# Patient Record
Sex: Male | Born: 1980
Health system: Southern US, Community
[De-identification: ages and names within clinical notes are randomized; demographics above are authoritative.]

## PROBLEM LIST (undated history)

## (undated) DIAGNOSIS — E559 Vitamin D deficiency, unspecified: Secondary | ICD-10-CM

## (undated) DIAGNOSIS — I10 Essential (primary) hypertension: Secondary | ICD-10-CM

## (undated) DIAGNOSIS — E291 Testicular hypofunction: Secondary | ICD-10-CM

## (undated) DIAGNOSIS — E119 Type 2 diabetes mellitus without complications: Secondary | ICD-10-CM

## (undated) DIAGNOSIS — G4733 Obstructive sleep apnea (adult) (pediatric): Secondary | ICD-10-CM

## (undated) HISTORY — PX: SHOULDER ARTHROSCOPY W/ ROTATOR CUFF REPAIR: SHX2400

## (undated) HISTORY — DX: Type 2 diabetes mellitus without complications: E11.9

## (undated) HISTORY — PX: ACNE CYST REMOVAL: SUR1112

## (undated) HISTORY — DX: Essential (primary) hypertension: I10

## (undated) HISTORY — DX: Obstructive sleep apnea (adult) (pediatric): G47.33

## (undated) HISTORY — DX: Vitamin D deficiency, unspecified: E55.9

## (undated) HISTORY — DX: Testicular hypofunction: E29.1

---

## 2003-03-13 ENCOUNTER — Emergency Department (HOSPITAL_COMMUNITY): Admission: EM | Admit: 2003-03-13 | Discharge: 2003-03-13 | Payer: Self-pay | Admitting: Emergency Medicine

## 2013-09-03 ENCOUNTER — Encounter (HOSPITAL_COMMUNITY): Payer: Self-pay | Admitting: Emergency Medicine

## 2013-09-03 ENCOUNTER — Emergency Department (HOSPITAL_COMMUNITY)
Admission: EM | Admit: 2013-09-03 | Discharge: 2013-09-03 | Disposition: A | Discharge status: Home / Self Care | Payer: 59 | Attending: Emergency Medicine | Admitting: Emergency Medicine

## 2013-09-03 ENCOUNTER — Emergency Department (HOSPITAL_COMMUNITY): Payer: 59

## 2013-09-03 DIAGNOSIS — R0789 Other chest pain: Secondary | ICD-10-CM | POA: Insufficient documentation

## 2013-09-03 LAB — CBC
HCT: 48.6 % (ref 39.0–52.0)
Hemoglobin: 17.5 g/dL — ABNORMAL HIGH (ref 13.0–17.0)
MCH: 32.3 pg (ref 26.0–34.0)
MCHC: 36 g/dL (ref 30.0–36.0)
MCV: 89.7 fL (ref 78.0–100.0)
Platelets: 236 10*3/uL (ref 150–400)
RBC: 5.42 MIL/uL (ref 4.22–5.81)
RDW: 12.5 % (ref 11.5–15.5)
WBC: 13.1 10*3/uL — ABNORMAL HIGH (ref 4.0–10.5)

## 2013-09-03 LAB — BASIC METABOLIC PANEL
BUN: 16 mg/dL (ref 6–23)
CO2: 22 mEq/L (ref 19–32)
Calcium: 9.4 mg/dL (ref 8.4–10.5)
Chloride: 105 mEq/L (ref 96–112)
Creatinine, Ser: 1 mg/dL (ref 0.50–1.35)
GFR calc Af Amer: >90 mL/min (ref >90)
GFR calc non Af Amer: >90 mL/min (ref >90)
Glucose, Bld: 108 mg/dL — ABNORMAL HIGH (ref 70–99)
Potassium: 4 mEq/L (ref 3.7–5.3)
Sodium: 141 mEq/L (ref 137–147)

## 2013-09-03 LAB — I-STAT TROPONIN, ED: Troponin i, poc: 0 ng/mL (ref 0.00–0.08)

## 2013-09-03 LAB — D-DIMER, QUANTITATIVE: D-Dimer, Quant: <0.27 ug/mL-FEU (ref 0.00–0.48)

## 2013-09-03 MED ORDER — HYDROCODONE-ACETAMINOPHEN 5-325 MG PO TABS: 1.0000 | ORAL_TABLET | ORAL | Status: DC | PRN

## 2013-09-03 MED ORDER — IBUPROFEN 600 MG PO TABS: 600.0000 mg | ORAL_TABLET | Freq: Four times a day (QID) | ORAL | Status: DC | PRN

## 2013-09-03 MED ORDER — KETOROLAC TROMETHAMINE 30 MG/ML IJ SOLN
30.0000 mg | Freq: Once | INTRAMUSCULAR | Status: AC
  Administered 2013-09-03: 30 mg via INTRAVENOUS
  Filled 2013-09-03: qty 1

## 2013-09-03 NOTE — Discharge Planning (Signed)
W9UE Felicia E, Community Liaison  Spoke to patient about follow up care. Explained to patient how to find a provider who accepts his insurance. Resource guide and my contact information was provider.

## 2013-09-03 NOTE — Discharge Instructions (Signed)

## 2013-09-03 NOTE — ED Notes (Signed)
Started two days ago, patient was working no heavy lifting but constant movement. Nausea no vomiting, weakness bilateral lower legs.  No medications taken at this time.

## 2013-09-03 NOTE — ED Provider Notes (Signed)
TIME SEEN: 2:16 PM  CHIEF COMPLAINT: Chest pain  HPI: Patient is a 33 y.o. M with no significant past medical history who presents to the emergency department with 2 days of constant substernal chest pain that he describes as a sharp pain. He denies any associated shortness of breath, nausea, vomiting, diaphoresis or dizziness. No lower extremity swelling or pain. No prior history of PE or DVT, recent prolonged immobilization such as long flight or hospitalization, recent fracture, surgery, trauma. He has a family history of his grandparents with cardiac disease but no history of premature CAD. Denies any fevers or cough. Denies a history is exertional. He states it is slightly pleuritic and worse with movement.  ROS: See HPI Constitutional: no fever  Eyes: no drainage  ENT: no runny nose   Cardiovascular:   chest pain  Resp: no SOB  GI: no vomiting GU: no dysuria Integumentary: no rash  Allergy: no hives  Musculoskeletal: no leg swelling  Neurological: no slurred speech ROS otherwise negative  PAST MEDICAL HISTORY/PAST SURGICAL HISTORY:  History reviewed. No pertinent past medical history.  MEDICATIONS:  Prior to Admission medications   Not on File    ALLERGIES:  No Known Allergies  SOCIAL HISTORY:  History  Substance Use Topics  . Smoking status: Never Smoker   . Smokeless tobacco: Not on file  . Alcohol Use: No    FAMILY HISTORY: History reviewed. No pertinent family history.  EXAM: BP 142/98  Pulse 105  Temp(Src) 97.9 F (36.6 C) (Oral)  Ht 6' (1.829 m)  Wt 301 lb (136.533 kg)  BMI 40.81 kg/m2  SpO2 96% CONSTITUTIONAL: Alert and oriented and responds appropriately to questions. Well-appearing; well-nourished HEAD: Normocephalic EYES: Conjunctivae clear, PERRL ENT: normal nose; no rhinorrhea; moist mucous membranes; pharynx without lesions noted NECK: Supple, no meningismus, no LAD  CARD: Regular and tachycardic; S1 and S2 appreciated; no murmurs, no clicks,  no rubs, no gallops RESP: Normal chest excursion without splinting or tachypnea; breath sounds clear and equal bilaterally; no wheezes, no rhonchi, no rales, mild tenderness to palpation over the anterior chest wall without crepitus or ecchymosis or deformity ABD/GI: Normal bowel sounds; non-distended; soft, non-tender, no rebound, no guarding BACK:  The back appears normal and is non-tender to palpation, there is no CVA tenderness EXT: Normal ROM in all joints; non-tender to palpation; no edema; normal capillary refill; no cyanosis    SKIN: Normal color for age and race; warm NEURO: Moves all extremities equally PSYCH: The patient's mood and manner are appropriate. Grooming and personal hygiene are appropriate.  MEDICAL DECISION MAKING: Patient here with atypical chest pain. His only risk factor for ACS is his family history but there is no history of premature CAD. He is mildly tachycardic but otherwise hemodynamically stable. We'll obtain cardiac labs, troponin, chest x-ray. We'll also obtain a d-dimer given he is tachycardic and has some pleuritic chest pain. We'll give Toradol and reassess.  ED PROGRESS: Patient reports his pain is markedly improved with Toradol. His labs are unremarkable including a negative troponin and d-dimer. Chest x-ray clear. Given his pain is been constant for 2 days, I do not feel he needs serial set of enzymes. I discussed with patient this is likely chest wall pain I feel he is safe to go home. Have given strict return precautions. Patient verbalizes understanding and is comfortable with this plan. He is pleased with his care.     Date: 09/03/2013  Rate: 102  Rhythm: Sinus tachycardia  QRS Axis:  normal  Intervals: normal  ST/T Wave abnormalities: normal  Conduction Disutrbances: none  Narrative Interpretation: Sinus tachycardia, no ischemic changes, no arrhythmia      Delice Bison Ward, DO 09/03/13 1526

## 2015-11-30 DIAGNOSIS — R079 Chest pain, unspecified: Secondary | ICD-10-CM | POA: Insufficient documentation

## 2015-12-01 DIAGNOSIS — R030 Elevated blood-pressure reading, without diagnosis of hypertension: Secondary | ICD-10-CM | POA: Insufficient documentation

## 2015-12-01 DIAGNOSIS — I10 Essential (primary) hypertension: Secondary | ICD-10-CM | POA: Insufficient documentation

## 2016-04-29 ENCOUNTER — Encounter (HOSPITAL_BASED_OUTPATIENT_CLINIC_OR_DEPARTMENT_OTHER): Payer: Self-pay | Admitting: *Deleted

## 2016-04-29 ENCOUNTER — Emergency Department (HOSPITAL_BASED_OUTPATIENT_CLINIC_OR_DEPARTMENT_OTHER)
Admission: EM | Admit: 2016-04-29 | Discharge: 2016-04-29 | Disposition: A | Discharge status: Home / Self Care | Payer: Self-pay | Attending: Emergency Medicine | Admitting: Emergency Medicine

## 2016-04-29 DIAGNOSIS — K529 Noninfective gastroenteritis and colitis, unspecified: Secondary | ICD-10-CM

## 2016-04-29 LAB — URINALYSIS, MICROSCOPIC (REFLEX): RBC / HPF: NONE SEEN RBC/hpf (ref 0–5)

## 2016-04-29 LAB — BASIC METABOLIC PANEL
Anion gap: 11 (ref 5–15)
BUN: 15 mg/dL (ref 6–20)
CO2: 22 mmol/L (ref 22–32)
Calcium: 9 mg/dL (ref 8.9–10.3)
Chloride: 106 mmol/L (ref 101–111)
Creatinine, Ser: 1 mg/dL (ref 0.61–1.24)
GFR calc Af Amer: >60 mL/min (ref >60)
GFR calc non Af Amer: >60 mL/min (ref >60)
Glucose, Bld: 106 mg/dL — ABNORMAL HIGH (ref 65–99)
Potassium: 4 mmol/L (ref 3.5–5.1)
Sodium: 139 mmol/L (ref 135–145)

## 2016-04-29 LAB — CBC WITH DIFFERENTIAL/PLATELET
Basophils Absolute: 0 10*3/uL (ref 0.0–0.1)
Basophils Relative: 0 %
Eosinophils Absolute: 0.2 10*3/uL (ref 0.0–0.7)
Eosinophils Relative: 2 %
HCT: 49.2 % (ref 39.0–52.0)
Hemoglobin: 16.9 g/dL (ref 13.0–17.0)
Lymphocytes Relative: 10 %
Lymphs Abs: 1.1 10*3/uL (ref 0.7–4.0)
MCH: 31.2 pg (ref 26.0–34.0)
MCHC: 34.3 g/dL (ref 30.0–36.0)
MCV: 90.9 fL (ref 78.0–100.0)
Monocytes Absolute: 0.9 10*3/uL (ref 0.1–1.0)
Monocytes Relative: 8 %
Neutro Abs: 9.1 10*3/uL — ABNORMAL HIGH (ref 1.7–7.7)
Neutrophils Relative %: 80 %
Platelets: 215 10*3/uL (ref 150–400)
RBC: 5.41 MIL/uL (ref 4.22–5.81)
RDW: 12.7 % (ref 11.5–15.5)
WBC: 11.4 10*3/uL — ABNORMAL HIGH (ref 4.0–10.5)

## 2016-04-29 LAB — URINALYSIS, ROUTINE W REFLEX MICROSCOPIC
Glucose, UA: NEGATIVE mg/dL
Hgb urine dipstick: NEGATIVE
Ketones, ur: NEGATIVE mg/dL
Nitrite: NEGATIVE
Protein, ur: NEGATIVE mg/dL
Specific Gravity, Urine: 1.024 (ref 1.005–1.030)
pH: 6 (ref 5.0–8.0)

## 2016-04-29 MED ORDER — KETOROLAC TROMETHAMINE 15 MG/ML IJ SOLN
15.0000 mg | Freq: Once | INTRAMUSCULAR | Status: AC
  Administered 2016-04-29: 15 mg via INTRAVENOUS
  Filled 2016-04-29: qty 1

## 2016-04-29 MED ORDER — SODIUM CHLORIDE 0.9 % IV BOLUS (SEPSIS)
1000.0000 mL | Freq: Once | INTRAVENOUS | Status: AC
  Administered 2016-04-29: 1000 mL via INTRAVENOUS

## 2016-04-29 MED ORDER — ONDANSETRON HCL 4 MG/2ML IJ SOLN
4.0000 mg | Freq: Once | INTRAMUSCULAR | Status: AC
  Administered 2016-04-29: 4 mg via INTRAVENOUS
  Filled 2016-04-29: qty 2

## 2016-04-29 NOTE — ED Notes (Signed)
Given saltines and sprite, states "feels much better" .

## 2016-04-29 NOTE — ED Provider Notes (Signed)
[place] [facility]PT [facility] Provider Note   CSN: [id number] Arrival date & time: 04/29/16  1749  By signing my name below, I, Hansel Feinstein, attest that this documentation has been prepared under the direction and in the presence of Virgel Manifold, MD. Electronically Signed: Hansel Feinstein, ED Scribe. 04/29/16. 6:14 PM.     History   Chief Complaint Chief Complaint  Patient presents with  . Emesis  . Back Pain  . Diarrhea    HPI Terrence Hoover is a 35 y.o. male who presents to the Emergency Department complaining of moderate, persistent nausea that began at 4 am this morning with associated emesis, diarrhea, generalized myalgias, chills, lower back pain. He also reports occasional epigastric cramping. Pt notes he ate chicken at a function last night, but has no other suspicious food intake. No known sick contact with similar symptoms, but pt works with EMS. Pt denies taking OTC medications at home to improve symptoms. He denies melena, hematochezia, hemoptysis.   The history is provided by the patient. No language interpreter was used.    History reviewed. No pertinent past medical history.  There are no active problems to display for this patient.   History reviewed. No pertinent surgical history.     Home Medications    Prior to Admission medications   Medication Sig Start Date End Date Taking? Authorizing Provider  HYDROcodone-acetaminophen (NORCO/VICODIN) 5-325 MG per tablet Take 1 tablet by mouth every 4 (four) hours as needed. 09/03/13   Kristen N Ward, DO  ibuprofen (ADVIL,MOTRIN) 600 MG tablet Take 1 tablet (600 mg total) by mouth every 6 (six) hours as needed. 09/03/13   [place], DO    Family History No family history on file.  Social History Social History  Substance Use Topics  . Smoking status: Never Smoker  . Smokeless tobacco: Never Used  . Alcohol use No     Allergies   Morphine and related   Review of Systems Review of Systems    Constitutional: Positive for chills.  Gastrointestinal: Positive for abdominal pain, diarrhea, nausea and vomiting. Negative for blood in stool.  Musculoskeletal: Positive for back pain and myalgias.  All other systems reviewed and are negative.   Physical Exam Updated Vital Signs BP 157/99   Pulse 120   Temp 99.3 F (37.4 C) (Oral)   Resp 20   Ht 6' (1.829 m)   Wt (!) 302 lb (137 kg)   SpO2 98%   BMI 40.96 kg/m   Physical Exam  Constitutional: He is oriented to person, place, and time. He appears well-developed and well-nourished.  HENT:  Head: Normocephalic and atraumatic.  Eyes: EOM are normal.  Neck: Normal range of motion.  Cardiovascular: Normal rate, regular rhythm, normal heart sounds and intact distal pulses.   Pulmonary/Chest: Effort normal and breath sounds normal. No respiratory distress.  Abdominal: Soft. He exhibits no distension. There is tenderness.  Mild epigastric tenderness.   Musculoskeletal: Normal range of motion.  Neurological: He is alert and oriented to person, place, and time.  Skin: Skin is warm and dry.  Psychiatric: He has a normal mood and affect. Judgment normal.  Nursing note and vitals reviewed.   ED Treatments / Results   DIAGNOSTIC STUDIES: Oxygen Saturation is 98% on RA, normal by my interpretation.    COORDINATION OF CARE: 6:09 PM Discussed treatment plan with pt at bedside which includes lab work and pt agreed to plan.    Labs (all labs ordered are listed, but only abnormal  results are displayed) Labs Reviewed  URINALYSIS, ROUTINE W REFLEX MICROSCOPIC    EKG  EKG Interpretation None       Radiology No results found.  Procedures Procedures (including critical care time)  Medications Ordered in ED Medications - No data to display   Initial Impression / Assessment and Plan / ED Course  I have reviewed the triage vital signs and the nursing notes.  Pertinent labs & imaging results that were available during my  care of the patient were reviewed by me and considered in my medical decision making (see chart for details).  Clinical Course     39 six-year-old male with what I suspect is viral GI illness. His normal exam is fairly benign. Minimal epigastric to periumbilical tenderness. Treated symptomatically significant improvement of his symptoms. His tachycardia has improved. He is normotensive. I doubt acute emergent surgical process or other emergent condition.  Final Clinical Impressions(s) / ED Diagnoses   Final diagnoses:  Gastroenteritis    New Prescriptions New Prescriptions   No medications on file    I personally preformed the services scribed in my presence. The recorded information has been reviewed is accurate. Virgel Manifold, MD.    Virgel Manifold, MD 05/04/16 [id number]

## 2016-04-29 NOTE — ED Triage Notes (Signed)
Nausea, vomiting and diarrhea. Back pain.

## 2016-04-29 NOTE — ED Notes (Signed)
States at 0430 this am started having diarrhea and cramping and lower back pain. Tender to epigastric area with palpation.

## 2017-01-21 ENCOUNTER — Emergency Department (HOSPITAL_BASED_OUTPATIENT_CLINIC_OR_DEPARTMENT_OTHER)
Admission: EM | Admit: 2017-01-21 | Discharge: 2017-01-22 | Disposition: A | Discharge status: Home / Self Care | Payer: 59 | Attending: Emergency Medicine | Admitting: Emergency Medicine

## 2017-01-21 ENCOUNTER — Encounter (HOSPITAL_BASED_OUTPATIENT_CLINIC_OR_DEPARTMENT_OTHER): Payer: Self-pay | Admitting: Emergency Medicine

## 2017-01-21 DIAGNOSIS — Y929 Unspecified place or not applicable: Secondary | ICD-10-CM | POA: Diagnosis not present

## 2017-01-21 DIAGNOSIS — Y939 Activity, unspecified: Secondary | ICD-10-CM | POA: Insufficient documentation

## 2017-01-21 DIAGNOSIS — S46011A Strain of muscle(s) and tendon(s) of the rotator cuff of right shoulder, initial encounter: Secondary | ICD-10-CM

## 2017-01-21 DIAGNOSIS — X58XXXA Exposure to other specified factors, initial encounter: Secondary | ICD-10-CM | POA: Insufficient documentation

## 2017-01-21 DIAGNOSIS — Y990 Civilian activity done for income or pay: Secondary | ICD-10-CM | POA: Diagnosis not present

## 2017-01-21 DIAGNOSIS — M25511 Pain in right shoulder: Secondary | ICD-10-CM | POA: Diagnosis present

## 2017-01-21 DIAGNOSIS — S43421A Sprain of right rotator cuff capsule, initial encounter: Secondary | ICD-10-CM | POA: Insufficient documentation

## 2017-01-21 DIAGNOSIS — Y999 Unspecified external cause status: Secondary | ICD-10-CM | POA: Insufficient documentation

## 2017-01-21 MED ORDER — KETOROLAC TROMETHAMINE 30 MG/ML IJ SOLN
30.0000 mg | Freq: Once | INTRAMUSCULAR | Status: AC
  Administered 2017-01-21: 30 mg via INTRAMUSCULAR
  Filled 2017-01-21: qty 1

## 2017-01-21 MED ORDER — IBUPROFEN 600 MG PO TABS: 600.0000 mg | ORAL_TABLET | Freq: Four times a day (QID) | ORAL | 0 refills | Status: DC | PRN

## 2017-01-21 NOTE — ED Triage Notes (Signed)
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 He then lifted her up and hurt his right shoulder. He now has pain to his right shoulder, elbow and hand - feels like it is swollen as well

## 2017-01-21 NOTE — ED Notes (Signed)
EDP into room, prior to RN assessment, see MD notes, orders received to medicate and d/c. Care assumed at time of d/c.

## 2017-01-21 NOTE — ED Provider Notes (Signed)
[place] [facility] Provider Note   CSN: [id number] Arrival date & time: 01/21/17  2141     History   Chief Complaint Chief Complaint  Patient presents with  . Shoulder Pain    HPI Erland Vivas is a 36 y.o. male.  HPI  36 y.o. male, presents to the Emergency Department today due to right shoulder pain. 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 Pt proceeded to lift up patient and hurt his right shoulder. Notes pain in shoulder with radiation of pain into elbow and hand. Rates pain 4/10. Worse with ROM. Minimal at rest. No direct trauma was noted to area. Pt states shoulder feels a little swollen. Has not tired any OTC remedies. No other symptoms noted.    History reviewed. No pertinent past medical history.  There are no active problems to display for this patient.   History reviewed. No pertinent surgical history.     Home Medications    Prior to Admission medications   Medication Sig Start Date End Date Taking? Authorizing Provider  HYDROcodone-acetaminophen (NORCO/VICODIN) 5-325 MG per tablet Take 1 tablet by mouth every 4 (four) hours as needed. 09/03/13   Ward, Delice Bison, DO  ibuprofen (ADVIL,MOTRIN) 600 MG tablet Take 1 tablet (600 mg total) by mouth every 6 (six) hours as needed. 09/03/13   Ward, Delice Bison, DO    Family History History reviewed. No pertinent family history.  Social History Social History  Substance Use Topics  . Smoking status: Never Smoker  . Smokeless tobacco: Never Used  . Alcohol use No     Allergies   Morphine and related   Review of Systems Review of Systems ROS reviewed and all are negative for acute change except as noted in the HPI.  Physical Exam Updated Vital Signs BP (!) 138/93 (BP Location: Left Arm)   Pulse 97   Temp 98.4 F (36.9 C) (Oral)   Resp 18   Ht 6' (1.829 m)   Wt 131.5 kg (290 lb)   SpO2 100%   BMI 39.33 kg/m   Physical Exam  Constitutional: He is oriented to  person, place, and time. Vital signs are normal. He appears well-developed and well-nourished.  HENT:  Head: Normocephalic and atraumatic.  Right Ear: Hearing normal.  Left Ear: Hearing normal.  Eyes: Pupils are equal, round, and reactive to light. Conjunctivae and EOM are normal.  Neck: Normal range of motion. Neck supple.  Cardiovascular: Normal rate and regular rhythm.   Pulmonary/Chest: Effort normal.  Musculoskeletal: Normal range of motion.  Right Shoulder Pain with active/passive ROM. Noted mild pain with inversion/eversion as well as abduction. Minimal pain with passive ROM. Notable pain with active. TTP along AC joint. NVI. Distal pulses appreciated.   Neurological: He is alert and oriented to person, place, and time.  Skin: Skin is warm and dry.  Psychiatric: He has a normal mood and affect. His speech is normal and behavior is normal. Thought content normal.  Nursing note and vitals reviewed.    ED Treatments / Results  Labs (all labs ordered are listed, but only abnormal results are displayed) Labs Reviewed - No data to display  EKG  EKG Interpretation None       Radiology No results found.  Procedures Procedures (including critical care time)  Medications Ordered in ED Medications  ketorolac (TORADOL) 30 MG/ML injection 30 mg (not administered)     Initial Impression / Assessment and Plan / ED Course  I have reviewed the triage vital signs and the nursing notes.  Pertinent labs & imaging results that were available during my care of the patient were reviewed by me and considered in my medical decision making (see chart for details).  Final Clinical Impressions(s) / ED Diagnoses     {I have reviewed the relevant previous healthcare records.  {I obtained HPI from historian.   ED Course:  Assessment: Pt is a 36 y.o. male presents to the Emergency Department today due to right shoulder pain. 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 Pt proceeded to lift up patient and hurt his right shoulder. Notes pain in shoulder with radiation of pain into elbow and hand. Rates pain 4/10. Worse with ROM. Minimal at rest. No direct trauma was noted to area. Pt states shoulder feels a little swollen. Has not tired any OTC remedies. On exam, pt in NAD. Nontoxic/nonseptic appearing. VSS. Afebrile. Suspect rotator cuff injury. Doubt tear given adequate active ROM. Doubt fracture as no direct trauma. Given Toradol in ED. Plan is to DC home with follow up to Orthopedics. At time of discharge, Patient is in no acute distress. Vital Signs are stable. Patient is able to ambulate. Patient able to tolerate PO.    Disposition/Plan:  DC Home Additional Verbal discharge instructions given and discussed with patient.  Pt Instructed to f/u with Ortho in the next week for evaluation and treatment of symptoms. Return precautions given Pt acknowledges and agrees with plan  Supervising Physician Orpah Greek, *  Final diagnoses:  Acute pain of right shoulder  Strain of right rotator cuff capsule, initial encounter    New Prescriptions New Prescriptions   No medications on file     Shary Decamp, Hershal Coria 01/21/17 2347    Orpah Greek, MD 01/22/17 [phone]

## 2017-01-21 NOTE — Discharge Instructions (Signed)
Please read and follow all provided instructions.  Your diagnoses today include:  1. Acute pain of right shoulder   2. Strain of right rotator cuff capsule, initial encounter     Tests performed today include: Vital signs. See below for your results today.   Medications prescribed:  Take as prescribed   Home care instructions:  Follow any educational materials contained in this packet.  Follow-up instructions: Please follow-up with your orthopedics for further evaluation of symptoms and treatment   Return instructions:  Please return to the Emergency Department if you do not get better, if you get worse, or new symptoms OR  - Fever (temperature greater than 101.58F)  - Bleeding that does not stop with holding pressure to the area    -Severe pain (please note that you may be more sore the day after your accident)  - Chest Pain  - Difficulty breathing  - Severe nausea or vomiting  - Inability to tolerate food and liquids  - Passing out  - Skin becoming red around your wounds  - Change in mental status (confusion or lethargy)  - New numbness or weakness    Please return if you have any other emergent concerns.  Additional Information:  Your vital signs today were: BP (!) 138/93 (BP Location: Left Arm)    Pulse 97    Temp 98.4 F (36.9 C) (Oral)    Resp 18    Ht 6' (1.829 m)    Wt 131.5 kg (290 lb)    SpO2 100%    BMI 39.33 kg/m  If your blood pressure (BP) was elevated above 135/85 this visit, please have this repeated by your doctor within one month. ---------------

## 2017-04-17 ENCOUNTER — Ambulatory Visit (INDEPENDENT_AMBULATORY_CARE_PROVIDER_SITE_OTHER): Payer: 59

## 2017-04-17 ENCOUNTER — Encounter (INDEPENDENT_AMBULATORY_CARE_PROVIDER_SITE_OTHER): Payer: Self-pay | Admitting: Orthopedic Surgery

## 2017-04-17 ENCOUNTER — Ambulatory Visit (INDEPENDENT_AMBULATORY_CARE_PROVIDER_SITE_OTHER): Payer: 59 | Admitting: Family

## 2017-04-17 DIAGNOSIS — M25511 Pain in right shoulder: Secondary | ICD-10-CM | POA: Diagnosis not present

## 2017-04-17 DIAGNOSIS — M7541 Impingement syndrome of right shoulder: Secondary | ICD-10-CM

## 2017-04-17 MED ORDER — LIDOCAINE HCL 1 % IJ SOLN
5.0000 mL | INTRAMUSCULAR | Status: AC | PRN
  Administered 2017-04-17: 5 mL

## 2017-04-17 MED ORDER — METHYLPREDNISOLONE ACETATE 40 MG/ML IJ SUSP
40.0000 mg | INTRAMUSCULAR | Status: AC | PRN
  Administered 2017-04-17: 40 mg via INTRA_ARTICULAR

## 2017-04-17 NOTE — Progress Notes (Signed)
   Office Visit Note   Patient: Terrence Hoover           Date of Birth: 02-12-1981           MRN: [id number] Visit Date: 04/17/2017              Requested by: No referring provider defined for this encounter. PCP: Patient, No Pcp Per  Chief Complaint  Patient presents with  . Right Shoulder - Pain      HPI: The patient is a 36 year old gentleman who presents today complaining of a 2 month history of right shoulder pain, primarily anterior that radiates down bicep. 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 No immediate sensation of popping or tearing. Has gradually worsened. Did have some relief a few weeks ago with Ibu. Pain has returned. Pain with above head, behind back reaching and lifting.   Assessment & Plan: Visit Diagnoses:  1. Right shoulder pain, unspecified chronicity   2. Impingement syndrome of right shoulder     Plan: depomedrol injection today. May consider MRI if no relief with injection. Continue icing. May use NSAIDs.  Follow-Up Instructions: Return in about 4 weeks (around 05/15/2017).   Right Shoulder Exam   Tenderness  The patient is experiencing tenderness in the biceps tendon.  Range of Motion  The patient has normal right shoulder ROM.  Tests  Impingement: positive Drop arm: negative  Other  Erythema: absent Sensation: normal Pulse: present      Patient is alert, oriented, no adenopathy, well-dressed, normal affect, normal respiratory effort.   Imaging: Xr Shoulder Right  Result Date: 04/17/2017 Radiographs of right shoulder are negative for acute finding.  No images are attached to the encounter.  Labs: No results found for: HGBA1C, ESRSEDRATE, CRP, LABURIC, REPTSTATUS, GRAMSTAIN, CULT, LABORGA  @LABSALLVALUES (HGBA1)@  There is no height or weight on file to calculate BMI.  Orders:  Orders Placed This Encounter  Procedures  . XR Shoulder Right   No orders of the defined types were placed in this  encounter.    Procedures: Large Joint Inj: R subacromial bursa on 04/17/2017 4:54 PM Indications: pain Details: 22 G 1.5 in needle Medications: 5 mL lidocaine 1 %; 40 mg methylPREDNISolone acetate 40 MG/ML Consent was given by the patient.      Clinical Data: No additional findings.  ROS:  All other systems negative, except as noted in the HPI. Review of Systems  Constitutional: Negative for chills and fever.  Musculoskeletal: Positive for arthralgias and myalgias.  Neurological: Negative for weakness and numbness.    Objective: Vital Signs: There were no vitals taken for this visit.  Specialty Comments:  No specialty comments available.  PMFS History: There are no active problems to display for this patient.  History reviewed. No pertinent past medical history.  History reviewed. No pertinent family history.  History reviewed. No pertinent surgical history. Social History   Occupational History  . Not on file  Tobacco Use  . Smoking status: Never Smoker  . Smokeless tobacco: Never Used  Substance and Sexual Activity  . Alcohol use: No  . Drug use: Not on file  . Sexual activity: Not on file

## 2017-05-22 ENCOUNTER — Ambulatory Visit (INDEPENDENT_AMBULATORY_CARE_PROVIDER_SITE_OTHER): Payer: 59 | Admitting: Family

## 2017-05-29 ENCOUNTER — Encounter (INDEPENDENT_AMBULATORY_CARE_PROVIDER_SITE_OTHER): Payer: Self-pay | Admitting: Orthopedic Surgery

## 2017-05-29 ENCOUNTER — Ambulatory Visit (INDEPENDENT_AMBULATORY_CARE_PROVIDER_SITE_OTHER): Payer: 59 | Admitting: Orthopedic Surgery

## 2017-05-29 DIAGNOSIS — M7541 Impingement syndrome of right shoulder: Secondary | ICD-10-CM

## 2017-05-29 DIAGNOSIS — M25511 Pain in right shoulder: Secondary | ICD-10-CM

## 2017-05-29 DIAGNOSIS — G8929 Other chronic pain: Secondary | ICD-10-CM | POA: Diagnosis not present

## 2017-05-29 NOTE — Progress Notes (Signed)
   Office Visit Note   Patient: Terrence Hoover           Date of Birth: 1981-01-08           MRN: [id number] Visit Date: 05/29/2017              Requested by: No referring provider defined for this encounter. PCP: Patient, No Pcp Per  Chief Complaint  Patient presents with  . Right Shoulder - Pain      HPI: Patient is a 37 year old gentleman who presents in follow-up for impingement symptoms of the right shoulder.  Patient has pain that radiates down into his biceps.  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  He has been to the emergency department at [facility] on his last examination here he did undergo a subacromial injection which she states provided him about 1 weeks pain relief.  Patient complains of pain with activities of daily living pain with elevation difficulty getting a shirt on and off.  Assessment & Plan: Visit Diagnoses:  1. Impingement syndrome of right shoulder     Plan: We will obtain an MRI scan of the right shoulder follow-up after this is obtained.  Discussed that he may benefit from arthroscopic intervention of the right shoulder.  Follow-Up Instructions: Return in about 2 weeks (around 06/12/2017).   Ortho Exam  Patient is alert, oriented, no adenopathy, well-dressed, normal affect, normal respiratory effort. Examination patient has full range of motion of the left shoulder right shoulder has active abduction flexion to 90 degrees.  He has good passive range of motion with no adhesive capsulitis.  He has pain with Neer and Hawkins impingement test pain with a drop arm test the biceps tendon is nontender to palpation the [facility] joint is nontender to palpation.  Imaging: No results found. No images are attached to the encounter.  Labs: No results found for: HGBA1C, ESRSEDRATE, CRP, LABURIC, REPTSTATUS, GRAMSTAIN, CULT, LABORGA  @LABSALLVALUES (HGBA1)@  There is no height or weight on file to calculate  BMI.  Orders:  No orders of the defined types were placed in this encounter.  No orders of the defined types were placed in this encounter.    Procedures: No procedures performed  Clinical Data: No additional findings.  ROS:  All other systems negative, except as noted in the HPI. Review of Systems  Objective: Vital Signs: There were no vitals taken for this visit.  Specialty Comments:  No specialty comments available.  PMFS History: There are no active problems to display for this patient.  History reviewed. No pertinent past medical history.  History reviewed. No pertinent family history.  History reviewed. No pertinent surgical history. Social History   Occupational History  . Not on file  Tobacco Use  . Smoking status: Never Smoker  . Smokeless tobacco: Never Used  Substance and Sexual Activity  . Alcohol use: No  . Drug use: Not on file  . Sexual activity: Not on file

## 2017-06-07 ENCOUNTER — Ambulatory Visit
Admission: RE | Admit: 2017-06-07 | Discharge: 2017-06-07 | Disposition: A | Discharge status: Home / Self Care | Payer: 59 | Source: Ambulatory Visit | Attending: Orthopedic Surgery | Admitting: Orthopedic Surgery

## 2017-06-07 DIAGNOSIS — M25511 Pain in right shoulder: Principal | ICD-10-CM

## 2017-06-07 DIAGNOSIS — G8929 Other chronic pain: Secondary | ICD-10-CM

## 2017-06-14 ENCOUNTER — Ambulatory Visit (INDEPENDENT_AMBULATORY_CARE_PROVIDER_SITE_OTHER): Payer: 59 | Admitting: Orthopedic Surgery

## 2017-06-14 ENCOUNTER — Encounter (INDEPENDENT_AMBULATORY_CARE_PROVIDER_SITE_OTHER): Payer: Self-pay | Admitting: Orthopedic Surgery

## 2017-06-14 DIAGNOSIS — S43431D Superior glenoid labrum lesion of right shoulder, subsequent encounter: Secondary | ICD-10-CM

## 2017-06-14 NOTE — Progress Notes (Addendum)
   Office Visit Note   Patient: Terrence Hoover           Date of Birth: 01-30-1981           MRN: [id number] Visit Date: 06/14/2017              Requested by: No referring provider defined for this encounter. PCP: Patient, No Pcp Per  Chief Complaint  Patient presents with  . Right Shoulder - Pain      HPI: Patient is a 37 year old gentleman with persistent rotator cuff pathology of his right shoulder he has failed conservative therapy and is status post MRI scan.  Patient states he still symptomatic anteriorly over the biceps.  Assessment & Plan: Visit Diagnoses:  1. Superior glenoid labrum lesion of right shoulder, subsequent encounter     Plan: Discussed continued conservative therapy versus arthroscopic intervention for debridement and decompression.  Patient states he would like to proceed with arthroscopic intervention.  Risk and benefits were discussed including infection persistent pain need for additional surgery.  Stated the patient should be approximately 75% better.  Patient states he understands wished to proceed at this time he would be out of work for at least 2 weeks possibly 4 weeks postoperatively.  Follow-Up Instructions: Return in about 2 weeks (around 06/28/2017).   Ortho Exam  Patient is alert, oriented, no adenopathy, well-dressed, normal affect, normal respiratory effort. Examination patient has full range of motion of both shoulders.  He has no tenderness to palpation over the [facility] joint bilaterally.  He is point tender to palpation of the biceps a empty can test reproduces labral pathology pain Neer and Hawkins impingement tests are painful.  Patient has good strength in his upper extremity.  Review of the MRI scan shows increased signal at the superior labrum consistent with a SLAP tear.  Patient does have some inflammation of the rotator cuff and some mild degenerative changes of the [facility] joint.  Imaging: No results found. No images are attached to the  encounter.  Labs: No results found for: HGBA1C, ESRSEDRATE, CRP, LABURIC, REPTSTATUS, GRAMSTAIN, CULT, LABORGA  @LABSALLVALUES (HGBA1)@  There is no height or weight on file to calculate BMI.  Orders:  No orders of the defined types were placed in this encounter.  No orders of the defined types were placed in this encounter.    Procedures: No procedures performed  Clinical Data: No additional findings.  ROS:  All other systems negative, except as noted in the HPI. Review of Systems  Objective: Vital Signs: There were no vitals taken for this visit.  Specialty Comments:  No specialty comments available.  PMFS History: There are no active problems to display for this patient.  History reviewed. No pertinent past medical history.  History reviewed. No pertinent family history.  History reviewed. No pertinent surgical history. Social History   Occupational History  . Not on file  Tobacco Use  . Smoking status: Never Smoker  . Smokeless tobacco: Never Used  Substance and Sexual Activity  . Alcohol use: No  . Drug use: Not on file  . Sexual activity: Not on file

## 2017-08-07 ENCOUNTER — Telehealth (INDEPENDENT_AMBULATORY_CARE_PROVIDER_SITE_OTHER): Payer: Self-pay | Admitting: Orthopedic Surgery

## 2017-08-07 NOTE — Telephone Encounter (Signed)
Patients wife called asking if she could speak with you about scheduling her husbands surgery. [phone]

## 2017-08-08 NOTE — Telephone Encounter (Signed)
Terrence Hoover spoke with [name] in the office today regarding surgery.

## 2017-08-15 DIAGNOSIS — M75121 Complete rotator cuff tear or rupture of right shoulder, not specified as traumatic: Secondary | ICD-10-CM | POA: Diagnosis not present

## 2017-08-15 DIAGNOSIS — M7541 Impingement syndrome of right shoulder: Secondary | ICD-10-CM | POA: Diagnosis not present

## 2017-08-22 ENCOUNTER — Encounter (INDEPENDENT_AMBULATORY_CARE_PROVIDER_SITE_OTHER): Payer: Self-pay | Admitting: Orthopedic Surgery

## 2017-08-22 ENCOUNTER — Ambulatory Visit (INDEPENDENT_AMBULATORY_CARE_PROVIDER_SITE_OTHER): Payer: 59 | Admitting: Orthopedic Surgery

## 2017-08-22 DIAGNOSIS — S43431D Superior glenoid labrum lesion of right shoulder, subsequent encounter: Secondary | ICD-10-CM

## 2017-08-22 DIAGNOSIS — M7541 Impingement syndrome of right shoulder: Secondary | ICD-10-CM

## 2017-08-22 NOTE — Progress Notes (Signed)
   Office Visit Note   Patient: Terrence Hoover           Date of Birth: 04/01/1981           MRN: [id number] Visit Date: 08/22/2017              Requested by: No referring provider defined for this encounter. PCP: Patient, No Pcp Per  Chief Complaint  Patient presents with  . Right Shoulder - Routine Post Op      HPI: Patient is a 37 year old gentleman status post right shoulder arthroscopy for debridement of labral tear debridement of rotator cuff tear as well as subacromial decompression.  Patient states he is improved range of motion he is not using the narcotic pain medicine.  Assessment & Plan: Visit Diagnoses:  1. Superior glenoid labrum lesion of right shoulder, subsequent encounter   2. Impingement syndrome of right shoulder     Plan: We will set him up for physical therapy at [facility] for range of motion strengthening and scapular stabilization.  Follow-up in 3 weeks with anticipated return to work at that time.  Follow-Up Instructions: Return in about 3 weeks (around 09/12/2017).   Ortho Exam  Patient is alert, oriented, no adenopathy, well-dressed, normal affect, normal respiratory effort. Examination the portals are clean and dry patient has good passive range of motion of the shoulder he is improving his active range of motion.  Imaging: No results found. No images are attached to the encounter.  Labs: No results found for: HGBA1C, ESRSEDRATE, CRP, LABURIC, REPTSTATUS, GRAMSTAIN, CULT, LABORGA  @LABSALLVALUES (HGBA1)@  There is no height or weight on file to calculate BMI.  Orders:  No orders of the defined types were placed in this encounter.  No orders of the defined types were placed in this encounter.    Procedures: No procedures performed  Clinical Data: No additional findings.  ROS:  All other systems negative, except as noted in the HPI. Review of Systems  Objective: Vital Signs: There were no vitals taken for this visit.  Specialty  Comments:  No specialty comments available.  PMFS History: There are no active problems to display for this patient.  History reviewed. No pertinent past medical history.  History reviewed. No pertinent family history.  History reviewed. No pertinent surgical history. Social History   Occupational History  . Not on file  Tobacco Use  . Smoking status: Never Smoker  . Smokeless tobacco: Never Used  Substance and Sexual Activity  . Alcohol use: No  . Drug use: Not on file  . Sexual activity: Not on file

## 2017-09-12 ENCOUNTER — Ambulatory Visit (INDEPENDENT_AMBULATORY_CARE_PROVIDER_SITE_OTHER): Payer: 59 | Admitting: Orthopedic Surgery

## 2017-09-12 ENCOUNTER — Encounter (INDEPENDENT_AMBULATORY_CARE_PROVIDER_SITE_OTHER): Payer: Self-pay | Admitting: Orthopedic Surgery

## 2017-09-12 DIAGNOSIS — S43431D Superior glenoid labrum lesion of right shoulder, subsequent encounter: Secondary | ICD-10-CM

## 2017-09-12 DIAGNOSIS — M7541 Impingement syndrome of right shoulder: Secondary | ICD-10-CM

## 2017-09-12 NOTE — Progress Notes (Signed)
   Office Visit Note   Patient: Terrence Hoover           Date of Birth: Oct 10, 1980           MRN: [id number] Visit Date: 09/12/2017              Requested by: No referring provider defined for this encounter. PCP: Patient, No Pcp Per  Chief Complaint  Patient presents with  . Right Shoulder - Pain      HPI: Patient is a 37 year old gentleman status post right shoulder arthroscopy debridement patient states he still has some soreness.  Denies any restrictions with range of motion.  States he cannot afford time physical therapy.  Assessment & Plan: Visit Diagnoses:  1. Superior glenoid labrum lesion of right shoulder, subsequent encounter   2. Impingement syndrome of right shoulder     Plan: Patient was given instructions for internal and external rotation strengthening scapular stabilization that he can do on his own.  Patient is given a note that he may return to work on May 4 without restrictions.  Follow-Up Instructions: Return if symptoms worsen or fail to improve.   Ortho Exam  Patient is alert, oriented, no adenopathy, well-dressed, normal affect, normal respiratory effort. Examination patient has full range of motion of the right shoulder there is no crepitation with range of motion the portals are clean and dry without signs of infection.  Imaging: No results found. No images are attached to the encounter.  Labs: No results found for: HGBA1C, ESRSEDRATE, CRP, LABURIC, REPTSTATUS, GRAMSTAIN, CULT, LABORGA  @LABALLVALUES (HGBA1C:3,ALB:3,PREALB:3)@  There is no height or weight on file to calculate BMI.  Orders:  No orders of the defined types were placed in this encounter.  No orders of the defined types were placed in this encounter.    Procedures: No procedures performed  Clinical Data: No additional findings.  ROS:  All other systems negative, except as noted in the HPI. Review of Systems  Objective: Vital Signs: There were no vitals taken for  this visit.  Specialty Comments:  No specialty comments available.  PMFS History: There are no active problems to display for this patient.  History reviewed. No pertinent past medical history.  History reviewed. No pertinent family history.  History reviewed. No pertinent surgical history. Social History   Occupational History  . Not on file  Tobacco Use  . Smoking status: Never Smoker  . Smokeless tobacco: Never Used  Substance and Sexual Activity  . Alcohol use: No  . Drug use: Not on file  . Sexual activity: Not on file

## 2018-07-31 ENCOUNTER — Ambulatory Visit: Payer: 59 | Admitting: Family Medicine

## 2018-08-08 ENCOUNTER — Ambulatory Visit: Payer: Self-pay | Admitting: Family Medicine

## 2018-08-31 ENCOUNTER — Ambulatory Visit: Payer: Self-pay

## 2018-08-31 NOTE — Telephone Encounter (Signed)
Call transferred to office per protocol  Answer Assessment - Initial Assessment Questions 1. REASON FOR CALL or QUESTION: "What is your reason for calling today?" or "How can I best help you?" or "What question do you have that I can help answer?"     I need to change my husbands appointment.  Protocols used: INFORMATION ONLY CALL-A-AH

## 2018-11-26 ENCOUNTER — Telehealth: Payer: Self-pay

## 2018-11-26 NOTE — Telephone Encounter (Signed)
Questions for Screening COVID-19  Symptom onset: none  Travel or Contacts: No travel, Yes contacts , however was in Full PPE on shift of [facility]. 3 positive cases while on shift.  During this illness, did/does the patient experience any of the following symptoms? Fever >100.55F []   Yes [x]   No []   Unknown Subjective fever (felt feverish) []   Yes [x]   No []   Unknown Chills []   Yes [x]   No []   Unknown Muscle aches (myalgia) []   Yes [x]   No []   Unknown Runny nose (rhinorrhea) []   Yes [x]   No []   Unknown Sore throat []   Yes [x]   No []   Unknown Cough (new onset or worsening of chronic cough) []   Yes [x]   No []   Unknown Shortness of breath (dyspnea) []   Yes [x]   No []   Unknown Nausea or vomiting []   Yes []   No []   Unknown Headache []   Yes [x]   No []   Unknown Abdominal pain  []   Yes [x]   No []   Unknown Diarrhea (?3 loose/looser than normal stools/24hr period) []   Yes [x]   No []   Unknown Other, specify:  Patient risk factors: Smoker? []   Current []   Former [x]   Never If male, currently pregnant? []   Yes [x]   No  There are no active problems to display for this patient.   Plan:  []   High risk for COVID-19 with red flags go to ED (with CP, SOB, weak/lightheaded, or fever > 101.5). Call ahead.  []   High risk for COVID-19 but stable. Inform provider and coordinate time for [facility] visit.   [x]   No red flags but URI signs or symptoms okay for [facility] visit.

## 2018-11-27 ENCOUNTER — Ambulatory Visit (INDEPENDENT_AMBULATORY_CARE_PROVIDER_SITE_OTHER): Payer: Managed Care, Other (non HMO) | Admitting: Family Medicine

## 2018-11-27 ENCOUNTER — Other Ambulatory Visit: Payer: Self-pay

## 2018-11-27 ENCOUNTER — Encounter: Payer: Self-pay | Admitting: Family Medicine

## 2018-11-27 VITALS — BP 138/98 | HR 90 | Temp 98.2°F | Resp 18 | Ht 70.25 in | Wt 325.0 lb

## 2018-11-27 DIAGNOSIS — Z13220 Encounter for screening for lipoid disorders: Secondary | ICD-10-CM | POA: Diagnosis not present

## 2018-11-27 DIAGNOSIS — G4700 Insomnia, unspecified: Secondary | ICD-10-CM | POA: Insufficient documentation

## 2018-11-27 DIAGNOSIS — Z23 Encounter for immunization: Secondary | ICD-10-CM

## 2018-11-27 DIAGNOSIS — F5101 Primary insomnia: Secondary | ICD-10-CM

## 2018-11-27 DIAGNOSIS — Z0000 Encounter for general adult medical examination without abnormal findings: Secondary | ICD-10-CM | POA: Insufficient documentation

## 2018-11-27 DIAGNOSIS — G8929 Other chronic pain: Secondary | ICD-10-CM

## 2018-11-27 DIAGNOSIS — M25512 Pain in left shoulder: Secondary | ICD-10-CM

## 2018-11-27 DIAGNOSIS — R5383 Other fatigue: Secondary | ICD-10-CM | POA: Diagnosis not present

## 2018-11-27 NOTE — Patient Instructions (Signed)
Preventive Care 19-39 Years Old, Male Preventive care refers to lifestyle choices and visits with your health care provider that can promote health and wellness. This includes:  A yearly physical exam. This is also called an annual well check.  Regular dental and eye exams.  Immunizations.  Screening for certain conditions.  Healthy lifestyle choices, such as eating a healthy diet, getting regular exercise, not using drugs or products that contain nicotine and tobacco, and limiting alcohol use. What can I expect for my preventive care visit? Physical exam Your health care provider will check:  Height and weight. These may be used to calculate body mass index (BMI), which is a measurement that tells if you are at a healthy weight.  Heart rate and blood pressure.  Your skin for abnormal spots. Counseling Your health care provider may ask you questions about:  Alcohol, tobacco, and drug use.  Emotional well-being.  Home and relationship well-being.  Sexual activity.  Eating habits.  Work and work Statistician. What immunizations do I need?  Influenza (flu) vaccine  This is recommended every year. Tetanus, diphtheria, and pertussis (Tdap) vaccine  You may need a Td booster every 10 years. Varicella (chickenpox) vaccine  You may need this vaccine if you have not already been vaccinated. Human papillomavirus (HPV) vaccine  If recommended by your health care provider, you may need three doses over 6 months. Measles, mumps, and rubella (MMR) vaccine  You may need at least one dose of MMR. You may also need a second dose. Meningococcal conjugate (MenACWY) vaccine  One dose is recommended if you are 45-76 years old and a Market researcher living in a residence hall, or if you have one of several medical conditions. You may also need additional booster doses. Pneumococcal conjugate (PCV13) vaccine  You may need this if you have certain conditions and were not  previously vaccinated. Pneumococcal polysaccharide (PPSV23) vaccine  You may need one or two doses if you smoke cigarettes or if you have certain conditions. Hepatitis A vaccine  You may need this if you have certain conditions or if you travel or work in places where you may be exposed to hepatitis A. Hepatitis B vaccine  You may need this if you have certain conditions or if you travel or work in places where you may be exposed to hepatitis B. Haemophilus influenzae type b (Hib) vaccine  You may need this if you have certain risk factors. You may receive vaccines as individual doses or as more than one vaccine together in one shot (combination vaccines). Talk with your health care provider about the risks and benefits of combination vaccines. What tests do I need? Blood tests  Lipid and cholesterol levels. These may be checked every 5 years starting at age 17.  Hepatitis C test.  Hepatitis B test. Screening   Diabetes screening. This is done by checking your blood sugar (glucose) after you have not eaten for a while (fasting).  Sexually transmitted disease (STD) testing. Talk with your health care provider about your test results, treatment options, and if necessary, the need for more tests. Follow these instructions at home: Eating and drinking   Eat a diet that includes fresh fruits and vegetables, whole grains, lean protein, and low-fat dairy products.  Take vitamin and mineral supplements as recommended by your health care provider.  Do not drink alcohol if your health care provider tells you not to drink.  If you drink alcohol: ? Limit how much you have to 0-2  drinks a day. ? Be aware of how much alcohol is in your drink. In the [place] one drink equals one 12 oz bottle of beer (355 mL), one 5 oz glass of wine (148 mL), or one 1 oz glass of hard liquor (44 mL). Lifestyle  Take daily care of your teeth and gums.  Stay active. Exercise for at least 30 minutes on 5 or  more days each week.  Do not use any products that contain nicotine or tobacco, such as cigarettes, e-cigarettes, and chewing tobacco. If you need help quitting, ask your health care provider.  If you are sexually active, practice safe sex. Use a condom or other form of protection to prevent STIs (sexually transmitted infections). What's next?  Go to your health care provider once a year for a well check visit.  Ask your health care provider how often you should have your eyes and teeth checked.  Stay up to date on all vaccines. This information is not intended to replace advice given to you by your health care provider. Make sure you discuss any questions you have with your health care provider. Document Released: 06/28/2001 Document Revised: 04/26/2018 Document Reviewed: 04/26/2018 Elsevier Patient Education  2020 Elsevier Inc.  

## 2018-11-27 NOTE — Assessment & Plan Note (Signed)
Well adult Orders Placed This Encounter  Procedures  . Tdap vaccine greater than or equal to 39yo IM  . Comp Met (CMET)  . CBC w/Diff  . TSH  . Lipid panel  . Testosterone  Immunizations: Tdap Screenings: lipid Anticipatory guidance/Risk factor reduction:  Counseling provided on weight loss and following healthy diet with regular exercise.  Additional recommendations per AVS

## 2018-11-27 NOTE — Addendum Note (Signed)
Addended by: Perlie Mayo on: 11/27/2018 01:23 PM   Modules accepted: Orders

## 2018-11-27 NOTE — Progress Notes (Signed)
Terrence Hoover - 37 y.o. male MRN [id number]  Date of birth: 1981/03/09  Subjective Chief Complaint  Patient presents with  . Establish Care    CPE/Labs?/Wt& Wt loss /PSA/Testerone/Orthostatic BP    HPI Terrence Hoover is a 38 y.o. male here today for initial visit and annual exam.  He has not had a primary care provider in almost 20 years.  He has a few concerns today including L shoulder pain, weight management, and sleep issues.    L shoulder pain started a few weeks ago.  Similar to shoulder pain he was having on the R that he had to have surgery for last year.  He would like referral back to [facility] for this.  He denies numbness, tingling or weakness.   Reports difficulty with weight loss throughout most of his life.  Admits that diet could be better.  He does play recreational sports but no aerobic exercise.  Admits to poor sleep due to working evening shifts.  When he is able to fall asleep he does not sleep well.  Wife has told him that he snores heavily.  He has not tried anything to help with sleep.  Also reports getting up frequently to urinate while sleeping which disrupts his sleep.    Review of Systems  Constitutional: Negative for chills, fever, malaise/fatigue and weight loss.  HENT: Negative for congestion, ear pain and sore throat.   Eyes: Negative for blurred vision, double vision and pain.  Respiratory: Negative for cough and shortness of breath.   Cardiovascular: Negative for chest pain and palpitations.  Gastrointestinal: Negative for abdominal pain, blood in stool, constipation, heartburn and nausea.  Genitourinary: Positive for frequency. Negative for dysuria and urgency.  Musculoskeletal: Positive for joint pain. Negative for myalgias.  Neurological: Negative for dizziness and headaches.  Endo/Heme/Allergies: Does not bruise/bleed easily.  Psychiatric/Behavioral: Negative for depression. The patient is not nervous/anxious and does not have  insomnia.     Allergies  Allergen Reactions  . Morphine Anaphylaxis and Shortness Of Breath    "stops breathing" "stops breathing"   . Morphine And Related Anaphylaxis  . Venomil Honey Bee Venom  [Honey Bee Venom] Anaphylaxis    History reviewed. No pertinent past medical history.  History reviewed. No pertinent surgical history.  Social History   Socioeconomic History  . Marital status: Married    Spouse name: Not on file  . Number of children: Not on file  . Years of education: Not on file  . Highest education level: Not on file  Occupational History  . Not on file  Social Needs  . Financial resource strain: Not hard at all  . Food insecurity    Worry: Never true    Inability: Never true  . Transportation needs    Medical: No    Non-medical: No  Tobacco Use  . Smoking status: Never Smoker  . Smokeless tobacco: Never Used  Substance and Sexual Activity  . Alcohol use: No  . Drug use: Never  . Sexual activity: Yes    Partners: Female    Birth control/protection: Condom  Lifestyle  . Physical activity    Days per week: 0 days    Minutes per session: 0 min  . Stress: To some extent  Relationships  . Social connections    Talks on phone: More than three times a week    Gets together: More than three times a week    Attends religious service: Not on file  Active member of club or organization: Not on file    Attends meetings of clubs or organizations: Not on file    Relationship status: Married  Other Topics Concern  . Not on file  Social History Narrative  . Not on file    History reviewed. No pertinent family history.  Health Maintenance  Topic Date Due  . HIV Screening  08/18/1994  . TETANUS/TDAP  08/18/1998  . INFLUENZA VACCINE  12/15/2018     ----------------------------------------------------------------------------------------------------------------------------------------------------------------------------------------------------------------- Physical Exam BP (!) 138/98   Pulse 90   Temp 98.2 F (36.8 C) (Oral)   Resp 18   Ht 5' 10.25" (1.784 m)   Wt (!) 325 lb (147.4 kg)   SpO2 97%   BMI 46.30 kg/m   Physical Exam Constitutional:      General: He is not in acute distress.    Appearance: He is obese.  HENT:     Head: Normocephalic and atraumatic.     Right Ear: External ear normal.     Left Ear: External ear normal.     Mouth/Throat:     Mouth: Mucous membranes are moist.  Eyes:     General: No scleral icterus. Neck:     Musculoskeletal: Normal range of motion.     Thyroid: No thyromegaly.  Cardiovascular:     Rate and Rhythm: Normal rate and regular rhythm.     Heart sounds: Normal heart sounds.  Pulmonary:     Effort: Pulmonary effort is normal.     Breath sounds: Normal breath sounds.  Abdominal:     General: Bowel sounds are normal. There is no distension.     Palpations: Abdomen is soft.     Tenderness: There is no abdominal tenderness. There is no guarding.  Lymphadenopathy:     Cervical: No cervical adenopathy.  Skin:    General: Skin is warm and dry.     Findings: No rash.  Neurological:     Mental Status: He is alert and oriented to person, place, and time.     Cranial Nerves: No cranial nerve deficit.     Motor: No abnormal muscle tone.  Psychiatric:        Mood and Affect: Mood normal.        Behavior: Behavior normal.     ------------------------------------------------------------------------------------------------------------------------------------------------------------------------------------------------------------------- Assessment and Plan  Morbid obesity ([place]) -Discussed weight loss strategies including a low fat diet rich in fruits in vegetables.  -Recommend regular,  aerobic exercise.  -Having issues with sleep, he will try adding melatonin.    Insomnia -He will try melatonin.  -Discussed recommendations for sleep study.  He would like to wait until lab results return.   Well adult exam Well adult Orders Placed This Encounter  Procedures  . Tdap vaccine greater than or equal to 7yo IM  . Comp Met (CMET)  . CBC w/Diff  . TSH  . Lipid panel  . Testosterone  Immunizations: Tdap Screenings: lipid Anticipatory guidance/Risk factor reduction:  Counseling provided on weight loss and following healthy diet with regular exercise.  Additional recommendations per AVS

## 2018-11-27 NOTE — Assessment & Plan Note (Addendum)
-  Discussed weight loss strategies including a low fat diet rich in fruits in vegetables.  -Recommend regular, aerobic exercise.  -Having issues with sleep, he will try adding melatonin.

## 2018-11-27 NOTE — Assessment & Plan Note (Signed)
-  He will try melatonin.  -Discussed recommendations for sleep study.  He would like to wait until lab results return.

## 2018-11-28 LAB — CBC WITH DIFFERENTIAL/PLATELET
Absolute Monocytes: 720 cells/uL (ref 200–950)
Basophils Absolute: 101 cells/uL (ref 0–200)
Basophils Relative: 1.4 %
Eosinophils Absolute: 562 cells/uL — ABNORMAL HIGH (ref 15–500)
Eosinophils Relative: 7.8 %
HCT: 46.7 % (ref 38.5–50.0)
Hemoglobin: 16.6 g/dL (ref 13.2–17.1)
Lymphs Abs: 2606 cells/uL (ref 850–3900)
MCH: 31.7 pg (ref 27.0–33.0)
MCHC: 35.5 g/dL (ref 32.0–36.0)
MCV: 89.3 fL (ref 80.0–100.0)
MPV: 12 fL (ref 7.5–12.5)
Monocytes Relative: 10 %
Neutro Abs: 3211 cells/uL (ref 1500–7800)
Neutrophils Relative %: 44.6 %
Platelets: 233 10*3/uL (ref 140–400)
RBC: 5.23 10*6/uL (ref 4.20–5.80)
RDW: 12.1 % (ref 11.0–15.0)
Total Lymphocyte: 36.2 %
WBC: 7.2 10*3/uL (ref 3.8–10.8)

## 2018-11-28 LAB — COMPREHENSIVE METABOLIC PANEL
AG Ratio: 1.7 (calc) (ref 1.0–2.5)
ALT: 35 U/L (ref 9–46)
AST: 19 U/L (ref 10–40)
Albumin: 4 g/dL (ref 3.6–5.1)
Alkaline phosphatase (APISO): 60 U/L (ref 36–130)
BUN: 14 mg/dL (ref 7–25)
CO2: 27 mmol/L (ref 20–32)
Calcium: 9.3 mg/dL (ref 8.6–10.3)
Chloride: 104 mmol/L (ref 98–110)
Creat: 1.01 mg/dL (ref 0.60–1.35)
Globulin: 2.4 g/dL (calc) (ref 1.9–3.7)
Glucose, Bld: 107 mg/dL — ABNORMAL HIGH (ref 65–99)
Potassium: 4.2 mmol/L (ref 3.5–5.3)
Sodium: 137 mmol/L (ref 135–146)
Total Bilirubin: 0.6 mg/dL (ref 0.2–1.2)
Total Protein: 6.4 g/dL (ref 6.1–8.1)

## 2018-11-28 LAB — LIPID PANEL
Cholesterol: 174 mg/dL (ref <200)
HDL: 38 mg/dL — ABNORMAL LOW (ref >=40)
LDL Cholesterol (Calc): 114 mg/dL (calc) — ABNORMAL HIGH
Non-HDL Cholesterol (Calc): 136 mg/dL (calc) — ABNORMAL HIGH (ref <130)
Total CHOL/HDL Ratio: 4.6 (calc) (ref <5.0)
Triglycerides: 114 mg/dL (ref <150)

## 2018-11-28 LAB — TSH: TSH: 3.62 mIU/L (ref 0.40–4.50)

## 2018-11-28 LAB — TESTOSTERONE: Testosterone: 276 ng/dL (ref 250–827)

## 2018-12-02 ENCOUNTER — Encounter: Payer: Self-pay | Admitting: Family Medicine

## 2018-12-02 DIAGNOSIS — R5383 Other fatigue: Secondary | ICD-10-CM

## 2018-12-02 DIAGNOSIS — F5101 Primary insomnia: Secondary | ICD-10-CM

## 2018-12-03 NOTE — Telephone Encounter (Signed)
Would recommend referral to nutritionist initially to see if there may be some additional changes that could be made to diet. Would also recommend eval for sleep apnea given his sleep issues and urinary frequency

## 2018-12-12 ENCOUNTER — Encounter: Payer: Self-pay | Admitting: Neurology

## 2018-12-12 ENCOUNTER — Ambulatory Visit: Payer: Managed Care, Other (non HMO) | Admitting: Neurology

## 2018-12-12 ENCOUNTER — Other Ambulatory Visit: Payer: Self-pay

## 2018-12-12 DIAGNOSIS — G4726 Circadian rhythm sleep disorder, shift work type: Secondary | ICD-10-CM | POA: Diagnosis not present

## 2018-12-12 DIAGNOSIS — Z6841 Body Mass Index (BMI) 40.0 and over, adult: Secondary | ICD-10-CM

## 2018-12-12 DIAGNOSIS — G44019 Episodic cluster headache, not intractable: Secondary | ICD-10-CM

## 2018-12-12 DIAGNOSIS — R351 Nocturia: Secondary | ICD-10-CM | POA: Diagnosis not present

## 2018-12-12 DIAGNOSIS — R0683 Snoring: Secondary | ICD-10-CM

## 2018-12-12 NOTE — Addendum Note (Signed)
Addended by: Larey Seat on: 12/12/2018 09:44 AM   Modules accepted: Orders

## 2018-12-12 NOTE — Progress Notes (Signed)
SLEEP MEDICINE CLINIC    Provider:  Larey Seat, MD  Primary Care Physician:  Luetta Nutting, DO [place] [id number]     Referring Provider: Luetta Nutting, Do [address] [place],  [place] [address]          Chief Complaint according to patient   Patient presents with:    . New Patient (Initial Visit)           HISTORY OF PRESENT ILLNESS:  Terrence Hoover is a 38 y.o. year old Caucasian male patient seen here upon a referral on 12/12/2018 . Chief concern according to patient : " I can't sleep through the night'    I have the pleasure of seeing Terrence Hoover today, a left-handed Caucasian male with a possible sleep disorder.  He has a  has no past medical history on file. he is a night shift worker and obese, has nocturia, snores and has Headaches.   Mrs. Corson described her husband as very restless. He snores loudly when on his back. He is a restless sleeper, has been for 2 years. He had right shoulder surgery , torn rotator cuff ligament. His left side is injured as well. He was already restless before onset.  He describes headaches that are waking him from sleep and are present when he wakes up for other reasons. He is a night shift Insurance underwriter. headaches arise from the occipital , paraspinal cervical area- and HA have been present for about 6 years, and he found aprtial relief after he changed jobs. .    Sleep relevant medical history: Nocturia 2-3 /night after he took melatonin  He only rises once.   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  Tonsillectomy- none.   Family medical /sleep history: no other family member on CPAP with OSA, insomnia.   Social history:  Patient is working as Neurosurgeon- paramedic, night shifts since 2008, and lives in a household with 5 persons. Family status is married  with 3 children.  Pets are present- one dog, not sleeping in the bedroom.  Tobacco use: none.  ETOH use : none. Caffeine  intake in form of Coffee( none ) Soda( 2/ shift ) Tea ( sweet tea - 3 glasses in daytime ) , no  energy drinks. Regular exercise in form of walking.   Hobbies :kickboxing-but the gym is currently closed.      Sleep habits are as follows: Night shift worker for over a decade . The patient's dinner time is between 7-8 AM . The patient goes to bed at 9 AM and continues to sleep for 1.5 hours, with melatonin he sleeps for 4-5 hours , he wakes for 2-4 bathroom breaks, the first time at 11 AM.   He sleeps with 15 mg of melatonin for about 7 -9 hours now. Bedroom is cool, dark, quiet. His wife is a loud snorer on weekends when both sleep at the same time.  The preferred sleep position is lateral and prone- never supine., with the support of 2-3 pillows. Dreams are reportedly frequent/vivid.  3.30 Pm  is the usual rise time. The patient wakes up spontaneously without an alarm.  He reports  feeling much more refreshed in AM after taking melatonin,  and is  restored in AM, but still has a  dry mouth, morning headaches and residual fatigue. He takes Excedrin.  Naps are not taken.    Review of Systems: Out of a  complete 14 system review, the patient complains of only the following symptoms, and all other reviewed systems are negative.:  Fatigue, sleepiness , snoring, fragmented sleep, Insomnia for sleeping through.    How likely are you to doze in the following situations: 0 = not likely, 1 = slight chance, 2 = moderate chance, 3 = high chance   Sitting and Reading? Watching Television? Sitting inactive in a public place (theater or meeting)? As a passenger in a car for an hour without a break? Lying down in the afternoon when circumstances permit? Sitting and talking to someone? Sitting quietly after lunch without alcohol? In a car, while stopped for a few minutes in traffic?   Total = 6/ 24 points   FSS endorsed at 41/ 63 points.   Social History   Socioeconomic History  . Marital status:  Married    Spouse name: Not on file  . Number of children: Not on file  . Years of education: Not on file  . Highest education level: Not on file  Occupational History  . Not on file  Social Needs  . Financial resource strain: Not hard at all  . Food insecurity    Worry: Never true    Inability: Never true  . Transportation needs    Medical: No    Non-medical: No  Tobacco Use  . Smoking status: Never Smoker  . Smokeless tobacco: Never Used  Substance and Sexual Activity  . Alcohol use: No  . Drug use: Never  . Sexual activity: Yes    Partners: Female    Birth control/protection: Condom  Lifestyle  . Physical activity    Days per week: 0 days    Minutes per session: 0 min  . Stress: To some extent  Relationships  . Social connections    Talks on phone: More than three times a week    Gets together: More than three times a week    Attends religious service: Not on file    Active member of club or organization: Not on file    Attends meetings of clubs or organizations: Not on file    Relationship status: Married  Other Topics Concern  . Not on file  Social History Narrative  . Not on file    No family history on file.  No past medical history on file.  No past surgical history on file.   Current Outpatient Medications on File Prior to Visit  Medication Sig Dispense Refill  . MELATONIN PO Take 15 mg by mouth at bedtime as needed.     No current facility-administered medications on file prior to visit.     Allergies  Allergen Reactions  . Morphine Anaphylaxis and Shortness Of Breath    "stops breathing" "stops breathing"   . Morphine And Related Anaphylaxis  . Venomil Honey Bee Venom  [Honey Bee Venom] Anaphylaxis    Physical exam:  Today's Vitals   12/12/18 0840  BP: (!) 142/95  Pulse: 78  Temp: (!) 96.9 F (36.1 C)  Weight: (!) 325 lb (147.4 kg)  Height: 5\' 11"  (1.803 m)   Body mass index is 45.33 kg/m.   Wt Readings from Last 3 Encounters:   12/12/18 (!) 325 lb (147.4 kg)  11/27/18 (!) 325 lb (147.4 kg)  01/21/17 290 lb (131.5 kg)     Ht Readings from Last 3 Encounters:  12/12/18 5\' 11"  (1.803 m)  11/27/18 5' 10.25" (1.784 m)  01/21/17 6' (1.829 m)  General: The patient is awake, alert and appears not in acute distress. The patient is well groomed. Head: Normocephalic, atraumatic. Neck is supple. Mallampati 5 highest grade ,  neck circumference:19.5 inches . Nasal airflow patent.  Retrognathia is not seen,but a short thick neck. .  Dental status: intact  Cardiovascular:  Regular rate and cardiac rhythm by pulse,  without distended neck veins. Respiratory: Lungs are clear to auscultation.  Skin:  Without evidence of ankle edema, or rash. Trunk: The patient's posture is erect.   Neurologic exam : The patient is awake and alert, oriented to place and time.   Memory subjective described as intact.  Attention span & concentration ability appears normal.  Speech is fluent,  without  dysarthria, dysphonia or aphasia.  Mood and affect are appropriate.   Cranial nerves: no loss of smell or taste reported  Pupils are equal and briskly reactive to light. Funduscopic exam deferred.  Extraocular movements in vertical and horizontal planes were intact and without nystagmus. No Diplopia. Visual fields by finger perimetry are intact. Hearing was intact to soft voice and finger rubbing.   Facial sensation intact to fine touch.  Facial motor strength is symmetric and tongue and uvula move midline.  Neck ROM : rotation, tilt and flexion extension were normal for age and shoulder shrug was symmetrical.    Motor exam:  Symmetric bulk, tone and ROM.   Normal tone without cog wheeling, symmetric grip strength .   Sensory:  Fine touch, pinprick and vibration were tested  and  normal.  Reported some numbness in  Left hand , affecting 3 fingers.  Proprioception tested in the upper extremities was normal.   Coordination: Rapid  alternating movements in the fingers/hands were of normal speed.  The Finger-to-nose maneuver was intact without evidence of ataxia, dysmetria or tremor.   Gait and station: Patient could rise unassisted from a seated position, walked without assistive device.  Stance is of normal width/ base and the patient turned with 3 steps.  Toe and heel walk were deferred.  Deep tendon reflexes: in the  upper and lower extremities are symmetric and intact.  Babinski response was deferred .       After spending a total time of  30 minutes face to face and additional time for physical and neurologic examination, review of laboratory studies,  personal review of imaging studies, reports and results of other testing and review of referral information / records as far as provided in visit, I have established the following assessments:  1) Snoring, and morning headaches, in the location of an  Right sided occipital neuralgia or radiculopathy- but alleviated with Excedrin.  2) left hand numbness, in the setting of a known shoulder injury.  3) Obesity BMI exceeds 40. High Mallampati and thick neck.  4) Shift work sleep disorder. Improved insomnia on melatonin.    My Plan is to proceed with:  1) HST -  2) Referral to medical weight and wellness.  3) continue melatonin.   I would like to thank Luetta Nutting, [place],  [place] [address] for allowing me to meet with and to take care of this pleasant patient.   In short, Terrence Hoover is presenting with shift work sleep disorder and headaches that wake him out of sleep and are present when he wakes for other reasons and he has  numbness on the left hand, a symptom that can be attributed to OSA and cervical radiculopathy. Marland Kitchen  I plan to follow up either personally or through our NP within 2-3  month.   CC: I will share my notes with PCP.  Electronically signed by: Larey Seat, MD 12/12/2018 9:03 AM  [facility] Board certified by The [employer] of Sleep Medicine and Diplomate of the [employer] of Sleep Medicine. Board certified In Neurology through the [place], Fellow of the [employer] of Neurology. Medical Director of [employer].

## 2018-12-12 NOTE — Patient Instructions (Signed)

## 2019-01-02 ENCOUNTER — Ambulatory Visit: Payer: Managed Care, Other (non HMO) | Admitting: Neurology

## 2019-01-02 DIAGNOSIS — G44019 Episodic cluster headache, not intractable: Secondary | ICD-10-CM

## 2019-01-02 DIAGNOSIS — G4726 Circadian rhythm sleep disorder, shift work type: Secondary | ICD-10-CM

## 2019-01-02 DIAGNOSIS — Z6841 Body Mass Index (BMI) 40.0 and over, adult: Secondary | ICD-10-CM

## 2019-01-02 DIAGNOSIS — G4733 Obstructive sleep apnea (adult) (pediatric): Secondary | ICD-10-CM | POA: Diagnosis not present

## 2019-01-02 DIAGNOSIS — R0683 Snoring: Secondary | ICD-10-CM

## 2019-01-02 DIAGNOSIS — R351 Nocturia: Secondary | ICD-10-CM

## 2019-01-09 ENCOUNTER — Encounter: Payer: Managed Care, Other (non HMO) | Attending: Family Medicine | Admitting: Registered"

## 2019-01-09 ENCOUNTER — Encounter: Payer: Self-pay | Admitting: Registered"

## 2019-01-09 ENCOUNTER — Other Ambulatory Visit: Payer: Self-pay

## 2019-01-09 NOTE — Patient Instructions (Addendum)
Instructions/Goals:  Make sure to get in three meals per day. Try to have balanced meals like the My Plate example (see handout). Include lean proteins, vegetables, fruits, and whole grains at meals.   Eat something every 3-5 hours while awake:   5 PM: Breakfast: Ideas: Greek yogurt and fruit; whole wheat toast with natural peanut butter spread and piece of fruit; boiled eggs, whole wheat toast, piece of fruit.   1030: Snack: Pack a meal replacement drink and a snack/small meal: low fat cheese and whole grain crackers; natural peanut butter and whole grain cracker or apple slices; Greek yogurt; boiled eggs and raw vegetables, whole grain; sandwich with whole wheat bread and lean meat, spinach, tomato etc.   12 Midnight: Lunch   5 AM: Dinner   Set aside a day for work meal prep and grocery buying-day before next work week starts.  Make physical activity a part of your week. Try to include at least 30 minutes of physical activity 5 days each week or at least 150 minutes per week. Regular physical activity promotes overall health-including helping to reduce risk for heart disease and diabetes, promoting mental health, and helping [place] sleep better.

## 2019-01-09 NOTE — Progress Notes (Addendum)
Medical Nutrition Therapy:  Appt start time: 1005 end time:  [id number].  Assessment:  Primary concerns today: Pt referred due to weight management. Pt present for appointment alone. 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 [place] reports he does not know what to do to lose weight. Pt reports he can workout and eat right and still will not lose weight. Reports he has always been "big," never a skinny person. Pt wants to know what to "take" or do to lose weight. Feels he does not eat that much. Pt reports he eats 2-3 times per day. Reports having lower appetite than in the past. Reports he has had testosterone tested and was told it was low but has not had it reassessed.   Pt works second shift and then on days off will follow regular daytime schedule with his family. Reports his on/off days are not consistent. Pt reports that on work days he will often not eat until at least 12 midnight after he has been awake since ~4PM and working since [employer]. Will also get something to eat at the end of his shift. Reports it can be difficult for him to have time to eat a regular meal earlier in his shift while at work because if there is a call he will have to end break and return to duty. Pt reports that he does have access to a refrigerator while working. Reports having a meal replacement drink would be easier for him to get in while working.   Food Allergies/Intolerances: None reported.   GI Concerns: None reported.  Sleep Pattern:  If on shift: 6-6.5 but not continuous. If sleeping at night pt will sleep less than 1 hour together. Reports he has been off since Saturday and has not slept a full night. Reports on previous night he slept 11:30-1:30 AM then woke up and went back to sleep 3-415 AM then was up at 6-630 AM. Pt reports he is a light sleeper and his wife's snoring wakes him up/keeps him awake. Pt is taking Melatonin but only getting 45 minutes more of sleep with the supplement. Reports sometimes he tries to sleep in the  living room, but reports his wife wants him to sleep in their room. Reports he has talked with her about it, but his wife does not want to see a doctor about it at this time.    Pertinent Lab Values:  11/27/2018:  Glucose: 107 HDL: 38 Non-HDL Cholesterol: 136  Weight Hx: See chart.   Body Composition Scale Date 01/09/19         Weight  320.1  Total Body Fat % 37.3  Total Body Fat lbs 118.5  Visceral Fat 29  Fat-Free Mass % 62.6   Total Body Water % 43.6   Muscle-Mass lbs 60.4  Body Fat Displacement          Torso  lbs 74.1         Left Leg  lbs 14.8         Right Leg  lbs 14.8         Left Arm  lbs 7.4         Right Arm   Lb 7.4    Preferred Learning Style:   No preference indicated   Learning Readiness:   Ready  MEDICATIONS: See list. Reviewed.    DIETARY INTAKE:  Usual eating pattern includes 2-3 meals and no snacks per day.   On work days: Wakes 330-4 PM and goes to bed between  1030-11 AM. Goes to work at 6-630 PM and shift starts at 7 PM.   On way home from work gets sausage and egg biscuit Sometimes he picks up something around 12 midnight. Gets up around 3 hours before work and has 1 hour commute   Camera operator and greek yogurt   First meal:   Off Days: Wakes around 7-10 AM and goes to bed around 10-12 PM  9 AM Avocado toast (2) with egg  1PM Lunch: bowl of cereal or sandwich   7PM: Dinner   Common foods: sausage and egg biscuit; avocado toast on wheat bread; apple or orange .  Avoided foods: radishes; brussels sprouts.    Typical Snacks: None reported.   Typical Beverages: 1 gallon water daily flavored with fruit; 1 cup (Coke) soda to wake him up in the morning. Gets in less water on days off.   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   L (230 PM): sopa, avocado slices, sweet tea  Snk ( PM): None reported.  D (7 PM): hamburger, chips, sweet tea  Snk ( PM): None reported.  Beverages: 48-64 oz water on off days; 1 gallon water on work days; sweet tea   Usual physical activity: kickboxing; walking  Minutes/Week: 2 days per week x 1.5 hours each time; walks 1 mile/10 minutes x 7 days per week.   Progress Towards Goal(s):  In progress.   Nutritional Diagnosis:  NI-5.11.1 Predicted suboptimal nutrient intake As related to inconsistent eating pattern.  As evidenced by pt reports going ~8 hours without eating while awake on work days.    Intervention:  Nutrition counseling provided. Provided education regarding balanced nutrition and importance of having consistent eating pattern. Discussed effects of skipping meals on health and weight (slowed metabolism, increased hunger at next meal, reduced energy). Discussed plan for eating consistently every ~3-5 hours on work days when pt reports struggling to eat regular meals. Set eating schedule while on 2nd shift and discussed ideas for balanced meals/snacks that would work for pt. Encouraged pt to continue to work in physical activity. Pt appeared agreeable to information/goals discussed.   Instructions/Goals:  Make sure to get in three meals per day. Try to have balanced meals like the My Plate example (see handout). Include lean proteins, vegetables, fruits, and whole grains at meals.   Eat something every 3-5 hours while awake:   5 PM: Breakfast: Ideas: Greek yogurt and fruit; whole wheat toast with natural peanut butter spread and piece of fruit; boiled eggs, whole wheat toast, piece of fruit.   1030: Snack: Pack a meal replacement drink and a snack/small meal: low fat cheese and whole grain crackers; natural peanut butter and whole grain cracker or apple slices; Greek yogurt; boiled eggs and raw vegetables, whole grain; sandwich with whole wheat bread and lean meat, spinach, tomato  etc.   12 Midnight: Lunch   5 AM: Dinner   Set aside a day for work meal prep and grocery buying-day before next work week starts.  Make physical activity a part of your week. Try to include at least 30 minutes of physical activity 5 days each week or at least 150 minutes per week. Regular physical activity promotes overall health-including helping to reduce risk for  heart disease and diabetes, promoting mental health, and helping [place] sleep better.    Teaching Method Utilized: Visual Auditory  Handouts given during visit include:  Balanced plate and food list.   Barriers to learning/adherence to lifestyle change: Inconsistent schedule.   Demonstrated degree of understanding via:  Teach Back   Monitoring/Evaluation:  Dietary intake, exercise, and body weight in 1 month(s).

## 2019-01-16 DIAGNOSIS — G4726 Circadian rhythm sleep disorder, shift work type: Secondary | ICD-10-CM | POA: Insufficient documentation

## 2019-01-16 DIAGNOSIS — R0683 Snoring: Secondary | ICD-10-CM | POA: Insufficient documentation

## 2019-01-16 DIAGNOSIS — G44019 Episodic cluster headache, not intractable: Secondary | ICD-10-CM | POA: Insufficient documentation

## 2019-01-16 DIAGNOSIS — G4733 Obstructive sleep apnea (adult) (pediatric): Secondary | ICD-10-CM | POA: Insufficient documentation

## 2019-01-16 DIAGNOSIS — R351 Nocturia: Secondary | ICD-10-CM | POA: Insufficient documentation

## 2019-01-16 NOTE — Addendum Note (Signed)
Addended by: Larey Seat on: 01/16/2019 05:25 PM   Modules accepted: Orders

## 2019-01-16 NOTE — Procedures (Signed)
Patient Information     First Name: Terrence Last Name: Hoover ID: [id number]  Birth Date: 09-25-1980 Age: 38 Gender: Male  Referring Provider: Luetta Nutting, DO BMI: 45.7 (W=326 lb, H=5' 11'')  Neck Circ.:  20 '' Epworth:  6   Sleep Study Information    Study Date: Jan 03, 2019 S/H/A Version: 001.001.001.001 / 4.1.1528 / 23  History:          Terrence Hoover is a 38 y.o. year old Caucasian male patient seen on 12/12/2018. Chief concern according to patient : " I can't sleep through the night'. I have the pleasure of seeing Terrence Hoover , a left handed Caucasian married male patient.  He has no medical history on file. He is a night shift worker and obese, has nocturia, snores and has Headaches. Mrs. Mikrut described her husband as very restless. He snores loudly when on his back, has been for 2 years. He had right shoulder surgery, torn rotator cuff ligament. His left side is injured as well. He was already restless before onset. He describes headaches that are waking him from sleep and are present when he wakes up for other reasons.  Headaches arise from the occipital -para spinal cervical area- and HA have been present for about 6 years, and he found partial relief after he changed jobs.    Summary & Diagnosis:       Recommendations:     Severe obstructive Sleep Apnea at an AHI of 43.3/h and mild exacerbation in REM to 45/h. The patient entered REM sleep fast and sustained REM sleep. He had minor oxygen desaturation and borderline tachy-brady cardia. Sleep was fragmented .  This severe obstructive sleep apnea is to be treated by CPAP or Inspire device.  I will order an autotitration capable CPAP device with a setting between 7 and 17 cm water, 2 cm EPR an mask of patient's comfort. Heated humidity provided.   Larey Seat, MD    01-16-2019         Sleep Summary  Oxygen Saturation Statistics   Start Study Time: End Study Time: Total Recording Time:  8:40:21 AM 2:58:22 PM  6 h, 12min  Total Sleep Time % REM of Sleep Time:  5 h, 16 min 27.8    Mean: 92 Minimum: 79 Maximum: 99  Mean of Desaturations Nadirs (%):   89  Oxygen Desat. %:   4-9 10-20 >20 Total  Events Number Total   136  6 95.8 4.2  0 0.0  142 100.0  Oxygen Saturation: <90 <=88 <85 <80 <70  Duration (minutes): Sleep % 33.2 10.5 23.4 2.6 7.4 0.8 0.0 0.0 0.0 0.0     Respiratory Indices      Total Events REM NREM All Night  pRDI:  244  pAHI:  227 ODI:  142   pAHIc:  9 % CSR: 0.0 48.2 45.5 37.9 1.4 45.9 42.5 23.0 2.6 46.5 43.3 27.1 2.2       Pulse Rate Statistics during Sleep (BPM)      Mean: 86 Minimum: 57 Maximum: 121    Indices are calculated using technically valid sleep time of  5 h, 14 min. Central-Indices are calculated using technically valid sleep time of  4  h, 8 min. pRDI /pAHI are calculated using 02 desaturations ? 3% Sit Body Position Statistics  Position Supine Prone Right Left Non-Supine  Sleep (min) 36.0 115.5 85.0 79.5 280.0  Sleep % 11.4 36.5 26.9 25.2 88.6  pRDI 91.7  36.5 39.6 48.3 40.8  pAHI 86.6 34.4 34.7 46.1 37.8  ODI 57.7 15.1 22.7 35.5 23.2     Snoring Statistics Snoring Level (dB) >40 >50 >60 >70 >80 >Threshold (45)  Sleep (min) 162.5 8.7 0.8 0.0 0.0 43.1  Sleep % 51.4 2.7 0.2 0.0 0.0 13.6    Mean: 42 dB Sleep Stages Chart

## 2019-01-17 ENCOUNTER — Telehealth: Payer: Self-pay | Admitting: Neurology

## 2019-01-17 NOTE — Telephone Encounter (Signed)
Called patient to discuss sleep study results. No answer at this time. LVM for the patient to call back.   

## 2019-01-17 NOTE — Telephone Encounter (Signed)
-----   Message from Larey Seat, MD sent at 01/16/2019  5:25 PM EDT ----- Summary & Diagnosis:      Recommendations:    Severe obstructive Sleep Apnea at an AHI of 43.3/h and mild  exacerbation in REM to 45/h. The patient entered REM sleep fast  and sustained REM sleep. He had minor oxygen desaturation and  borderline tachy-brady cardia. Sleep was fragmented .  This severe obstructive sleep apnea is to be treated by CPAP or  Inspire device.  I will order an autotitration capable CPAP device with a setting  between 7 and 17 cm water, 2 cm EPR an mask of patient's comfort.  Heated humidity provided.   Larey Seat, MD  01-16-2019

## 2019-01-22 ENCOUNTER — Encounter: Payer: Self-pay | Admitting: Neurology

## 2019-01-23 ENCOUNTER — Encounter: Payer: Self-pay | Admitting: Family Medicine

## 2019-01-23 NOTE — Telephone Encounter (Signed)
I called pt. I advised pt that Dr. Brett Fairy reviewed their sleep study results and found that pt moderate to severe sleep apnea. Dr. Brett Fairy recommends that pt has . I reviewed PAP compliance expectations with the pt. Pt is agreeable to starting a CPAP. I advised pt that an order will be sent to a DME, [facility], and [facility] will call the pt within about one week after they file with the pt's insurance. [facility] will show the pt how to use the machine, fit for masks, and troubleshoot the CPAP if needed. A follow up appt was made for insurance purposes with Dr. Brett Fairy on Nov 4,2020 at 2:30 pm . Pt verbalized understanding to arrive 15 minutes early and bring their CPAP. A letter with al l of this information in it will be mailed to the pt as a reminder. I verified with the pt that the address we have on file is correct. Pt verbalized understanding of results. Pt had no questions at this time but was encouraged to call back if questions arise. I have sent the order to [facility] and have received confirmation that they have received the order.

## 2019-01-23 NOTE — Telephone Encounter (Signed)
Yes, please schedule for F2F.  Thanks!

## 2019-01-24 ENCOUNTER — Other Ambulatory Visit: Payer: Self-pay

## 2019-01-25 ENCOUNTER — Encounter: Payer: Self-pay | Admitting: Family Medicine

## 2019-01-25 ENCOUNTER — Ambulatory Visit: Payer: Managed Care, Other (non HMO) | Admitting: Family Medicine

## 2019-01-25 VITALS — BP 138/90 | HR 91 | Temp 98.1°F | Ht 71.0 in | Wt 323.8 lb

## 2019-01-25 DIAGNOSIS — Z111 Encounter for screening for respiratory tuberculosis: Secondary | ICD-10-CM | POA: Diagnosis not present

## 2019-01-25 DIAGNOSIS — L918 Other hypertrophic disorders of the skin: Secondary | ICD-10-CM

## 2019-01-25 DIAGNOSIS — Z23 Encounter for immunization: Secondary | ICD-10-CM | POA: Diagnosis not present

## 2019-01-25 DIAGNOSIS — D179 Benign lipomatous neoplasm, unspecified: Secondary | ICD-10-CM | POA: Diagnosis not present

## 2019-01-25 DIAGNOSIS — L989 Disorder of the skin and subcutaneous tissue, unspecified: Secondary | ICD-10-CM

## 2019-01-25 DIAGNOSIS — Z0184 Encounter for antibody response examination: Secondary | ICD-10-CM | POA: Diagnosis not present

## 2019-01-25 NOTE — Patient Instructions (Signed)
Keep area clean and dry Change bandage tomorrow.  Antibiotic ointment then cover with 2x2 or folded 4x4 gauze.  Call for any increased pain or drainage.  We'll call you with lab results and when paperwork is completed.

## 2019-01-27 ENCOUNTER — Encounter: Payer: Self-pay | Admitting: Family Medicine

## 2019-01-27 DIAGNOSIS — Z0000 Encounter for general adult medical examination without abnormal findings: Secondary | ICD-10-CM | POA: Insufficient documentation

## 2019-01-27 DIAGNOSIS — L989 Disorder of the skin and subcutaneous tissue, unspecified: Secondary | ICD-10-CM | POA: Insufficient documentation

## 2019-01-27 DIAGNOSIS — L918 Other hypertrophic disorders of the skin: Secondary | ICD-10-CM | POA: Insufficient documentation

## 2019-01-27 DIAGNOSIS — Z0184 Encounter for antibody response examination: Secondary | ICD-10-CM | POA: Insufficient documentation

## 2019-01-27 NOTE — Assessment & Plan Note (Signed)
-#  8 skin tags treated with cryo today, see procedure note.

## 2019-01-27 NOTE — Assessment & Plan Note (Signed)
-  Area on leg with appearance of condyloma rather than skin tag.  Lesion removed and sent for pathology.

## 2019-01-27 NOTE — Assessment & Plan Note (Signed)
-  Titers ordered today.  Orders Placed This Encounter  Procedures  . Flu Vaccine QUAD 6+ mos PF IM (Fluarix Quad PF)  . Measles/Mumps/Rubella Immunity  . Varicella zoster antibody, IgG  . QuantiFERON-TB Gold Plus  . Hepatitis B surface antibody,quantitative

## 2019-01-27 NOTE — Progress Notes (Signed)
Terrence Hoover - 38 y.o. male MRN [id number]  Date of birth: July 26, 1980  Subjective Chief Complaint  Patient presents with  . Procedure    Has skin tags on right side of neck and a big one where his thigh meeets his left buttock    HPI Terrence Hoover is a 38 y.o. male with recent diagnosis of severe OSA here today to have to have skin tags evaluated and removed.  He also needs paperwork completed for school with immunity status testing.  He is still awaiting equipment for treatment for his newly diagnosed OSA.    Skin tags:  Reports skin tags located on neck as well as posterior L thigh.  These have been present for several months to years.  Area on thigh is larger than other areas.  He reports that areas become irritated frequently and will often get caught on clothing. He denies bleeding of these areas.   ROS:  A comprehensive ROS was completed and negative except as noted per HPI  Allergies  Allergen Reactions  . Morphine Anaphylaxis and Shortness Of Breath    "stops breathing" "stops breathing"   . Morphine And Related Anaphylaxis  . Venomil Honey Bee Venom  [Honey Bee Venom] Anaphylaxis    No past medical history on file.  No past surgical history on file.  Social History   Socioeconomic History  . Marital status: Married    Spouse name: Not on file  . Number of children: Not on file  . Years of education: Not on file  . Highest education level: Not on file  Occupational History  . Not on file  Social Needs  . Financial resource strain: Not hard at all  . Food insecurity    Worry: Never true    Inability: Never true  . Transportation needs    Medical: No    Non-medical: No  Tobacco Use  . Smoking status: Never Smoker  . Smokeless tobacco: Never Used  Substance and Sexual Activity  . Alcohol use: No  . Drug use: Never  . Sexual activity: Yes    Partners: Female    Birth control/protection: Condom  Lifestyle  . Physical activity    Days per week: 0  days    Minutes per session: 0 min  . Stress: To some extent  Relationships  . Social connections    Talks on phone: More than three times a week    Gets together: More than three times a week    Attends religious service: Not on file    Active member of club or organization: Not on file    Attends meetings of clubs or organizations: Not on file    Relationship status: Married  Other Topics Concern  . Not on file  Social History Narrative  . Not on file    Family History  Problem Relation Age of Onset  . Hypertension Mother   . Hypertension Maternal Grandmother   . COPD Maternal Grandfather   . Cancer Maternal Grandfather   . Hypertension Maternal Grandfather   . Stroke Maternal Grandfather   . Heart attack Maternal Grandfather     Health Maintenance  Topic Date Due  . HIV Screening  08/18/1994  . TETANUS/TDAP  11/26/2028  . INFLUENZA VACCINE  Completed    ----------------------------------------------------------------------------------------------------------------------------------------------------------------------------------------------------------------- Physical Exam BP 138/90 (BP Location: Left Arm, Patient Position: Sitting, Cuff Size: Large)   Pulse 91   Temp 98.1 F (36.7 C) (Oral)   Ht 5\' 11"  (1.803 m)  Wt (!) 323 lb 12.8 oz (146.9 kg)   SpO2 96%   BMI 45.16 kg/m   Physical Exam Constitutional:      Appearance: Normal appearance.  HENT:     Head: Normocephalic and atraumatic.  Cardiovascular:     Rate and Rhythm: Normal rate and regular rhythm.  Pulmonary:     Effort: Pulmonary effort is normal.     Breath sounds: Normal breath sounds.  Skin:    General: Skin is warm and dry.     Comments: Multiple small, pedunculated skin tags to L side of neck.  There is a larger ~1.5cm pedunculated lesion to L upper, inner thigh.    Neurological:     General: No focal deficit present.     Mental Status: He is alert.  Psychiatric:        Mood and  Affect: Mood normal.        Behavior: Behavior normal.     Procedure notes:  #1.Discussed procedures with patient and questions answered.  Allergies reviewed and verified.  8 smaller skin tags on L neck were treated with cryotherapy using Histofreezer. He tolerated this well without side effects.    #2. Area on inner thigh was prepped in typical sterile fashion and injected with 4 mL of 1% lidocaine with epinephrine.  After adequate anesthesia the lesion was shaved using a #10 blade.  There was a small amount of capillary bleeding that was controlled with silver nitrate and direct pressure.  Antibiotic ointment and bandage applied.  He tolerated procedure well.  EBL: Minimal.  Post procedure instructions given.   ------------------------------------------------------------------------------------------------------------------------------------------------------------------------------------------------------------------- Assessment and Plan  Skin lesion -Area on leg with appearance of condyloma rather than skin tag.  Lesion removed and sent for pathology.   Acrochordon -#8 skin tags treated with cryo today, see procedure note.   Immunity status testing -Titers ordered today.  Orders Placed This Encounter  Procedures  . Flu Vaccine QUAD 6+ mos PF IM (Fluarix Quad PF)  . Measles/Mumps/Rubella Immunity  . Varicella zoster antibody, IgG  . QuantiFERON-TB Gold Plus  . Hepatitis B surface antibody,quantitative

## 2019-01-28 LAB — MEASLES/MUMPS/RUBELLA IMMUNITY
Mumps IgG: <9 AU/mL — ABNORMAL LOW
Rubella: 3.78 index
Rubeola IgG: 13.7 AU/mL — ABNORMAL LOW

## 2019-01-28 LAB — QUANTIFERON-TB GOLD PLUS
Mitogen-NIL: >10 IU/mL
NIL: 0.05 IU/mL
QuantiFERON-TB Gold Plus: NEGATIVE
TB1-NIL: 0 IU/mL
TB2-NIL: 0.02 IU/mL

## 2019-01-28 LAB — VARICELLA ZOSTER ANTIBODY, IGG: Varicella IgG: 251.6 index

## 2019-01-28 LAB — HEPATITIS B SURFACE ANTIBODY, QUANTITATIVE: Hep B S AB Quant (Post): 68 m[IU]/mL (ref >=10)

## 2019-01-29 ENCOUNTER — Encounter: Payer: Self-pay | Admitting: Family Medicine

## 2019-01-30 ENCOUNTER — Telehealth: Payer: Self-pay | Admitting: Neurology

## 2019-01-30 LAB — PATHOLOGY REPORT

## 2019-01-30 LAB — TISSUE SPECIMEN

## 2019-01-30 NOTE — Telephone Encounter (Signed)
I have sent this information to [facility] and they are the ones that will send this information to the patient insurance company. If they call back please defer to [facility] [phone]

## 2019-01-30 NOTE — Telephone Encounter (Signed)
Lashay from [facility] called stating that they are needing a prescription stating what kind of machine and mask the pt is needing. Please advise.

## 2019-02-07 ENCOUNTER — Ambulatory Visit: Payer: Managed Care, Other (non HMO)

## 2019-02-07 ENCOUNTER — Encounter: Payer: Self-pay | Admitting: Family Medicine

## 2019-02-07 ENCOUNTER — Ambulatory Visit: Payer: Managed Care, Other (non HMO) | Admitting: Registered"

## 2019-02-14 ENCOUNTER — Other Ambulatory Visit: Payer: Self-pay

## 2019-02-14 ENCOUNTER — Ambulatory Visit (INDEPENDENT_AMBULATORY_CARE_PROVIDER_SITE_OTHER): Payer: Managed Care, Other (non HMO) | Admitting: Behavioral Health

## 2019-02-14 DIAGNOSIS — Z23 Encounter for immunization: Secondary | ICD-10-CM

## 2019-02-14 NOTE — Progress Notes (Signed)
Patient presents in clinic for MMR vaccination. Verified with Dr. Bryan Lemma any contraindications of this vaccine. SQ injection was given in the left arm. Patient tolerated the injection well. No signs or symptoms of a reaction were noted prior to patient leaving the nurse visit. VIS sheet was given to the patient.

## 2019-02-19 ENCOUNTER — Encounter: Payer: Self-pay | Admitting: Family Medicine

## 2019-02-20 NOTE — Telephone Encounter (Signed)
Needs hearing, vision and urinalysis.  Otherwise forms are complete.

## 2019-02-25 ENCOUNTER — Other Ambulatory Visit: Payer: Managed Care, Other (non HMO)

## 2019-02-25 ENCOUNTER — Encounter: Payer: Self-pay | Admitting: Family Medicine

## 2019-03-05 ENCOUNTER — Other Ambulatory Visit: Payer: Managed Care, Other (non HMO)

## 2019-03-13 NOTE — Telephone Encounter (Signed)
Received a e-mail from [facility] stating the pt has not yet started his machine. The pt will have to reach out to [facility] when he is ready to schedule.   "Just letting you know that this patient was scheduled for 9/24. He was a no show. I have since called him on 10/5, 10/6, and 10/12. No answer LVM. Email was also sent on 10/12. I am going to void this order."

## 2019-03-18 ENCOUNTER — Telehealth: Payer: Self-pay | Admitting: Neurology

## 2019-03-18 NOTE — Telephone Encounter (Signed)
Called to inform the patient that I had him scheduled for a initial cpap visit on 11/4. Per [facility] the patient has not started the machine and that was the main purpose of the visit was to look at compliance download from initial CPAP. LVM informing the patient to call back if he would like to cancel the apt for wed to avoid any late fee, I advised our office will be closed on 11/3 and informed us to let us know. Informed if he wanted to come in that is fine too. Just wanted to give the options

## 2019-03-20 ENCOUNTER — Ambulatory Visit: Payer: Managed Care, Other (non HMO) | Admitting: Neurology

## 2019-03-20 ENCOUNTER — Encounter: Payer: Self-pay | Admitting: Family Medicine

## 2019-03-20 NOTE — Telephone Encounter (Signed)
Pt was a no show to the apt for today

## 2019-03-21 ENCOUNTER — Encounter: Payer: Self-pay | Admitting: Neurology

## 2019-03-25 ENCOUNTER — Encounter: Payer: Self-pay | Admitting: Family Medicine

## 2019-03-26 ENCOUNTER — Other Ambulatory Visit: Payer: Managed Care, Other (non HMO)

## 2019-04-09 ENCOUNTER — Ambulatory Visit (INDEPENDENT_AMBULATORY_CARE_PROVIDER_SITE_OTHER): Payer: Managed Care, Other (non HMO) | Admitting: Family Medicine

## 2019-04-22 ENCOUNTER — Ambulatory Visit (INDEPENDENT_AMBULATORY_CARE_PROVIDER_SITE_OTHER): Payer: Managed Care, Other (non HMO) | Admitting: Family Medicine

## 2019-06-19 ENCOUNTER — Ambulatory Visit (INDEPENDENT_AMBULATORY_CARE_PROVIDER_SITE_OTHER): Payer: Managed Care, Other (non HMO) | Admitting: Bariatrics

## 2019-07-03 ENCOUNTER — Ambulatory Visit (INDEPENDENT_AMBULATORY_CARE_PROVIDER_SITE_OTHER): Payer: Managed Care, Other (non HMO) | Admitting: Bariatrics

## 2019-09-06 ENCOUNTER — Other Ambulatory Visit: Payer: Self-pay

## 2019-09-06 ENCOUNTER — Ambulatory Visit: Payer: Self-pay

## 2019-09-06 ENCOUNTER — Encounter: Payer: Self-pay | Admitting: Family Medicine

## 2019-09-06 ENCOUNTER — Ambulatory Visit: Payer: Managed Care, Other (non HMO) | Admitting: Family Medicine

## 2019-09-06 VITALS — BP 131/84 | HR 90 | Ht 72.0 in | Wt 325.0 lb

## 2019-09-06 DIAGNOSIS — G5621 Lesion of ulnar nerve, right upper limb: Secondary | ICD-10-CM

## 2019-09-06 DIAGNOSIS — M7711 Lateral epicondylitis, right elbow: Secondary | ICD-10-CM | POA: Insufficient documentation

## 2019-09-06 DIAGNOSIS — M25521 Pain in right elbow: Secondary | ICD-10-CM

## 2019-09-06 MED ORDER — PREDNISONE 5 MG PO TABS: ORAL_TABLET | ORAL | 0 refills | Status: DC

## 2019-09-06 NOTE — Assessment & Plan Note (Signed)
Symptoms seem to be progressing over the past 6 months.  Possible for a radial nerve entrapment versus and/or an ulnar nerve entrapment.  He feels weakness more in the hand and pain more around the elbow. -Counseled on home exercise therapy and supportive care. -Prednisone. -Nerve conduction study. -Could consider injection physical therapy or imaging.

## 2019-09-06 NOTE — Patient Instructions (Signed)
Nice to meet you Please try the prednisone  The neurology office should give you a call   Please send me a message in [place] with any questions or updates.  We will set up a virtual visit once the nerve study has been resulted.   --Dr. Raeford Razor

## 2019-09-06 NOTE — Progress Notes (Signed)
Terrence Hoover - 39 y.o. male MRN [id number]  Date of birth: 12/09/1980  SUBJECTIVE:  Including CC & ROS.  Chief Complaint  Patient presents with  . Elbow Pain    right elbow    Terrence Hoover is a 39 y.o. male that is presenting with weakness and pain around the right elbow for about 6 months.  He denies any specific inciting event.  He feels pain over the lateral aspect of the right elbow with some radiation distally.  He also feels some pain in the cubital tunnel that radiates distally.  He feels weakness in his hands is unable to grip or pick up things that he normally would.  Seems to have gotten worse through this period of time.  He has a history of right shoulder surgery in 2019.  He was recovered and felt normal from that surgery.  Denies any diabetes or thyroid disorder.   Review of Systems See HPI   HISTORY: Past Medical, Surgical, Social, and Family History Reviewed & Updated per EMR.   Pertinent Historical Findings include:  No past medical history on file.  No past surgical history on file.  Family History  Problem Relation Age of Onset  . Hypertension Mother   . Hypertension Maternal Grandmother   . COPD Maternal Grandfather   . Cancer Maternal Grandfather   . Hypertension Maternal Grandfather   . Stroke Maternal Grandfather   . Heart attack Maternal Grandfather     Social History   Socioeconomic History  . Marital status: Married    Spouse name: Not on file  . Number of children: Not on file  . Years of education: Not on file  . Highest education level: Not on file  Occupational History  . Not on file  Tobacco Use  . Smoking status: Never Smoker  . Smokeless tobacco: Never Used  Substance and Sexual Activity  . Alcohol use: No  . Drug use: Never  . Sexual activity: Yes    Partners: Female    Birth control/protection: Condom  Other Topics Concern  . Not on file  Social History Narrative  . Not on file   Social Determinants of Health    Financial Resource Strain: Low Risk   . Difficulty of Paying Living Expenses: Not hard at all  Food Insecurity: No Food Insecurity  . Worried About Charity fundraiser in the Last Year: Never true  . Ran Out of Food in the Last Year: Never true  Transportation Needs: No Transportation Needs  . Lack of Transportation (Medical): No  . Lack of Transportation (Non-Medical): No  Physical Activity: Inactive  . Days of Exercise per Week: 0 days  . Minutes of Exercise per Session: 0 min  Stress: Stress Concern Present  . Feeling of Stress : To some extent  Social Connections: Unknown  . Frequency of Communication with Friends and Family: More than three times a week  . Frequency of Social Gatherings with Friends and Family: More than three times a week  . Attends Religious Services: Not on file  . Active Member of Clubs or Organizations: Not on file  . Attends Archivist Meetings: Not on file  . Marital Status: Married  Human resources officer Violence: Not At Risk  . Fear of Current or Ex-Partner: No  . Emotionally Abused: No  . Physically Abused: No  . Sexually Abused: No     PHYSICAL EXAM:  VS: BP 131/84   Pulse 90   Ht 6' (1.829  m)   Wt (!) 325 lb (147.4 kg)   BMI 44.08 kg/m  Physical Exam Gen: NAD, alert, cooperative with exam, well-appearing MSK:  Right elbow: No signs of atrophy. Normal supination and pronation. Normal flexion and extension. No instability. No tenderness to palpation of the lateral condyle. Negative Tinel's at the elbow. Neurovascularly intact  Limited ultrasound: Right elbow:  Normal appearing origin at the lateral condyle. No joint effusion. Normal-appearing radial nerve at the antecubital fossa. Normal-appearing median nerve at the antecubital fossa.   No translation of the ulnar nerve in the cubital tunnel upon dynamic testing.   No changes on static testing.  Summary: No abnormal findings to explain symptoms.  Ultrasound and  interpretation by Clearance Coots, MD    ASSESSMENT & PLAN:   Ulnar neuropathy at elbow of right upper extremity Symptoms seem to be progressing over the past 6 months.  Possible for a radial nerve entrapment versus and/or an ulnar nerve entrapment.  He feels weakness more in the hand and pain more around the elbow. -Counseled on home exercise therapy and supportive care. -Prednisone. -Nerve conduction study. -Could consider injection physical therapy or imaging.

## 2019-10-17 ENCOUNTER — Other Ambulatory Visit: Payer: Self-pay

## 2019-10-17 DIAGNOSIS — G5621 Lesion of ulnar nerve, right upper limb: Secondary | ICD-10-CM

## 2019-10-21 ENCOUNTER — Ambulatory Visit (INDEPENDENT_AMBULATORY_CARE_PROVIDER_SITE_OTHER): Payer: Managed Care, Other (non HMO) | Admitting: Neurology

## 2019-10-21 ENCOUNTER — Ambulatory Visit: Payer: Managed Care, Other (non HMO) | Admitting: Neurology

## 2019-10-21 ENCOUNTER — Encounter: Payer: Self-pay | Admitting: Neurology

## 2019-10-21 DIAGNOSIS — G5621 Lesion of ulnar nerve, right upper limb: Secondary | ICD-10-CM | POA: Diagnosis not present

## 2019-10-21 NOTE — Procedures (Signed)
     HISTORY:  Terrence Hoover is a 39 year old gentleman with a several month history of pain around the left elbow and forearm with lifting or gripping objects.  The patient denies any significant numbness of the arm or hand.  He is being evaluated for the above issue.  NERVE CONDUCTION STUDIES:  Nerve conduction studies were performed on the right upper extremity.  The distal motor latency and motor amplitude for the right ulnar nerve was normal.  The distal motor latency of the right median nerve was prolonged with a low motor amplitude, but stimulation of the ulnar nerve also results in a motor response of the APB muscle suggestive of a Martin-Gruber anastomosis.  The median and ulnar sensory latencies were unremarkable.  The F-wave latency for the right ulnar nerve was normal.   EMG STUDIES:  EMG study was performed on the right upper extremity:  The first dorsal interosseous muscle reveals 2 to 4 K units with full recruitment. No fibrillations or positive waves were noted. The abductor pollicis brevis muscle reveals 2 to 4 K units with full recruitment. No fibrillations or positive waves were noted. The extensor indicis proprius muscle reveals 1 to 3 K units with full recruitment. No fibrillations or positive waves were noted. The pronator teres muscle reveals 2 to 3 K units with full recruitment. No fibrillations or positive waves were noted. The biceps muscle reveals 1 to 2 K units with full recruitment. No fibrillations or positive waves were noted. The triceps muscle reveals 2 to 4 K units with full recruitment. No fibrillations or positive waves were noted. The anterior deltoid muscle reveals 2 to 3 K units with full recruitment. No fibrillations or positive waves were noted. The cervical paraspinal muscles were tested at 2 levels. No abnormalities of insertional activity were seen at either level tested. There was good relaxation.   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  The EMG evaluation of the right upper extremity is unremarkable, no evidence of an overlying cervical radiculopathy is seen.  Clinical evaluation this patient suggests a possible right lateral epicondylitis.  Jill Alexanders MD 10/21/2019 4:43 PM  [facility] [address] [place], [place] [address]  Phone [phone] Fax [phone]

## 2019-10-21 NOTE — Progress Notes (Signed)
Please refer to EMG and nerve conduction procedure note.  

## 2019-10-21 NOTE — Progress Notes (Signed)
[place]    Nerve / Sites Muscle Latency Ref. Amplitude Ref. Rel Amp Segments Distance Velocity Ref. Area    ms ms mV mV %  cm m/s m/s mVms  R Median - APB     Wrist APB 5.3 ?4.4 2.5 ?4.0 100 Wrist - APB 7   6.7     Upper arm APB 9.3  5.7  227 Upper arm - Wrist 24 60 ?49 19.7     Ulnar Wrist APB 4.3  3.4  59.7 Ulnar Wrist - Upper arm    4.4     Ulnar B. Elbow APB 8.7  2.7  77.9 Ulnar Wrist - Wrist    3.9     Ulnar A. Elbow APB 10.0  2.7  102 Ulnar B. Elbow - Ulnar Wrist    4.5         Ulnar B. Elbow - Wrist             Ulnar A. Elbow - APB      R Ulnar - ADM     Wrist ADM 3.2 ?3.3 7.2 ?6.0 100 Wrist - ADM 7   14.8     B.Elbow ADM 6.8  11.9  166 B.Elbow - Wrist 22 61 ?49 36.6     A.Elbow ADM 8.4  11.6  97 A.Elbow - B.Elbow 10 62 ?49 32.6         A.Elbow - Wrist             SNC    Nerve / Sites Rec. Site Peak Lat Ref.  Amp Ref. Segments Distance    ms ms V V  cm  R Median - Orthodromic (Dig II, Mid palm)     Dig II Wrist 3.4 ?3.4 15 ?10 Dig II - Wrist 14  R Ulnar - Orthodromic, (Dig V, Mid palm)     Dig V Wrist 2.3 ?3.1 6 ?5 Dig V - Wrist 29         F  Wave    Nerve F Lat Ref.   ms ms  R Ulnar - ADM 29.2 ?32.0

## 2019-10-23 ENCOUNTER — Telehealth (INDEPENDENT_AMBULATORY_CARE_PROVIDER_SITE_OTHER): Payer: Managed Care, Other (non HMO) | Admitting: Family Medicine

## 2019-10-23 DIAGNOSIS — M7711 Lateral epicondylitis, right elbow: Secondary | ICD-10-CM

## 2019-10-23 NOTE — Assessment & Plan Note (Signed)
Nerve study was unrevealing for a source of his symptoms.  Possible for lateral epicondylitis.  He has also had his shoulder operated on previously.  Could be radiation from the shoulder. -We will try an injection at the lateral condyle to see if that is the source of his symptoms. -May need to evaluate his shoulder if no improvement with injection versus MRI.

## 2019-10-23 NOTE — Progress Notes (Addendum)
Virtual Visit via Video Note  I connected with Terrence Hoover on 10/23/19 at  1:30 PM EDT by a video enabled telemedicine application and verified that I am speaking with the correct person using two identifiers.   I discussed the limitations of evaluation and management by telemedicine and the availability of in person appointments. The patient expressed understanding and agreed to proceed.  Patient: home  Physician: office.   History of Present Illness:  Terrence Hoover   is following up after his EMG of his right extremity.  The EMG was concluded to be normal.  He is still having ongoing symptoms around his elbow. He did get improvement with the prednisone but his pain returned.  Observations/Objective:  Gen: NAD, alert, cooperative with exam, well-appearing  Assessment and Plan:  Lateral epicondylitis of right elbow: Nerve study was unrevealing for a source of his symptoms.  Possible for lateral epicondylitis.  He has also had his shoulder operated on previously.  Could be radiation from the shoulder. -We will try an injection at the lateral condyle to see if that is the source of his symptoms. -May need to evaluate his shoulder if no improvement with injection versus MRI.  Follow Up Instructions:    I discussed the assessment and treatment plan with the patient. The patient was provided an opportunity to ask questions and all were answered. The patient agreed with the plan and demonstrated an understanding of the instructions.   The patient was advised to call back or seek an in-person evaluation if the symptoms worsen or if the condition fails to improve as anticipated.    Clearance Coots, MD

## 2019-10-25 ENCOUNTER — Other Ambulatory Visit: Payer: Self-pay

## 2019-10-25 ENCOUNTER — Ambulatory Visit: Payer: Self-pay

## 2019-10-25 ENCOUNTER — Ambulatory Visit (INDEPENDENT_AMBULATORY_CARE_PROVIDER_SITE_OTHER): Payer: Managed Care, Other (non HMO) | Admitting: Family Medicine

## 2019-10-25 ENCOUNTER — Encounter: Payer: Self-pay | Admitting: Family Medicine

## 2019-10-25 VITALS — BP 141/96 | HR 94 | Ht 72.0 in | Wt 320.0 lb

## 2019-10-25 DIAGNOSIS — M7711 Lateral epicondylitis, right elbow: Secondary | ICD-10-CM

## 2019-10-25 MED ORDER — METHYLPREDNISOLONE ACETATE 40 MG/ML IJ SUSP
40.0000 mg | Freq: Once | INTRAMUSCULAR | Status: AC
  Administered 2019-10-25: 40 mg via INTRA_ARTICULAR

## 2019-10-25 NOTE — Assessment & Plan Note (Signed)
EMG was unrevealing for a nerve origin of his pain. Possible for Tennis elbow.  - injection  - counseled on HEP and supportive care - if no improvement may consider MRI of elbow vs looking to the shoulder for radiating pain. He has history of surgery of the right shoulder.

## 2019-10-25 NOTE — Progress Notes (Signed)
Terrence Hoover - 39 y.o. male MRN [id number]  Date of birth: Nov 20, 1980  SUBJECTIVE:  Including CC & ROS.  Chief Complaint  Patient presents with  . Elbow Pain    right    Terrence Hoover is a 39 y.o. male that is  Following up for his right arm pain. The EMG done was unrevealing for a nerve pathology. Still having ongoing pain. Pain is occurring on the lateral elbow and across the antecubial fossa.    Review of Systems See HPI   HISTORY: Past Medical, Surgical, Social, and Family History Reviewed & Updated per EMR.   Pertinent Historical Findings include:  No past medical history on file.  No past surgical history on file.  Family History  Problem Relation Age of Onset  . Hypertension Mother   . Hypertension Maternal Grandmother   . COPD Maternal Grandfather   . Cancer Maternal Grandfather   . Hypertension Maternal Grandfather   . Stroke Maternal Grandfather   . Heart attack Maternal Grandfather     Social History   Socioeconomic History  . Marital status: Married    Spouse name: Not on file  . Number of children: Not on file  . Years of education: Not on file  . Highest education level: Not on file  Occupational History  . Not on file  Tobacco Use  . Smoking status: Never Smoker  . Smokeless tobacco: Never Used  Vaping Use  . Vaping Use: Never used  Substance and Sexual Activity  . Alcohol use: No  . Drug use: Never  . Sexual activity: Yes    Partners: Female    Birth control/protection: Condom  Other Topics Concern  . Not on file  Social History Narrative  . Not on file   Social Determinants of Health   Financial Resource Strain: Low Risk   . Difficulty of Paying Living Expenses: Not hard at all  Food Insecurity: No Food Insecurity  . Worried About Charity fundraiser in the Last Year: Never true  . Ran Out of Food in the Last Year: Never true  Transportation Needs: No Transportation Needs  . Lack of Transportation (Medical): No  . Lack of  Transportation (Non-Medical): No  Physical Activity: Inactive  . Days of Exercise per Week: 0 days  . Minutes of Exercise per Session: 0 min  Stress: Stress Concern Present  . Feeling of Stress : To some extent  Social Connections: Unknown  . Frequency of Communication with Friends and Family: More than three times a week  . Frequency of Social Gatherings with Friends and Family: More than three times a week  . Attends Religious Services: Not on file  . Active Member of Clubs or Organizations: Not on file  . Attends Archivist Meetings: Not on file  . Marital Status: Married  Human resources officer Violence: Not At Risk  . Fear of Current or Ex-Partner: No  . Emotionally Abused: No  . Physically Abused: No  . Sexually Abused: No     PHYSICAL EXAM:  VS: BP (!) 141/96   Pulse 94   Ht 6' (1.829 m)   Wt (!) 320 lb (145.2 kg)   BMI 43.40 kg/m  Physical Exam Gen: NAD, alert, cooperative with exam, well-appearing   Aspiration/Injection Procedure Note CONG Terrence Hoover December 31, 1980  Procedure: Injection Indications: Right elbow pain   Procedure Details Consent: Risks of procedure as well as the alternatives and risks of each were explained to the (patient/caregiver).  Consent  for procedure obtained. Time Out: Verified patient identification, verified procedure, site/side was marked, verified correct patient position, special equipment/implants available, medications/allergies/relevent history reviewed, required imaging and test results available.  Performed.  The area was cleaned with iodine and alcohol swabs.    The right lateral epidondyle was injected using 1 cc's of 40 mg depo-medrol and 2 cc's of 0.25% bupivacaine  with a 25 1 1/2" needle.  Ultrasound was used. Images were obtained in long views showing the injection.     A sterile dressing was applied.  Patient did tolerate procedure well.      ASSESSMENT & PLAN:   Lateral epicondylitis of right elbow EMG was  unrevealing for a nerve origin of his pain. Possible for Tennis elbow.  - injection  - counseled on HEP and supportive care - if no improvement may consider MRI of elbow vs looking to the shoulder for radiating pain. He has history of surgery of the right shoulder.

## 2019-10-25 NOTE — Patient Instructions (Signed)
Good to see you Please try ice  Please try the exercises  Please send me a message in MyChart with any questions or updates.  Please see me back in 4 weeks.   --Dr. Andrue Dini  

## 2019-11-22 ENCOUNTER — Ambulatory Visit: Payer: Managed Care, Other (non HMO) | Admitting: Family Medicine

## 2019-11-22 NOTE — Progress Notes (Deleted)
  Terrence Hoover - 39 y.o. male MRN [id number]  Date of birth: Oct 13, 1980  SUBJECTIVE:  Including CC & ROS.  No chief complaint on file.   Terrence Hoover is a 39 y.o. male that is  ***.  ***   Review of Systems See HPI   HISTORY: Past Medical, Surgical, Social, and Family History Reviewed & Updated per EMR.   Pertinent Historical Findings include:  No past medical history on file.  No past surgical history on file.  Family History  Problem Relation Age of Onset  . Hypertension Mother   . Hypertension Maternal Grandmother   . COPD Maternal Grandfather   . Cancer Maternal Grandfather   . Hypertension Maternal Grandfather   . Stroke Maternal Grandfather   . Heart attack Maternal Grandfather     Social History   Socioeconomic History  . Marital status: Married    Spouse name: Not on file  . Number of children: Not on file  . Years of education: Not on file  . Highest education level: Not on file  Occupational History  . Not on file  Tobacco Use  . Smoking status: Never Smoker  . Smokeless tobacco: Never Used  Vaping Use  . Vaping Use: Never used  Substance and Sexual Activity  . Alcohol use: No  . Drug use: Never  . Sexual activity: Yes    Partners: Female    Birth control/protection: Condom  Other Topics Concern  . Not on file  Social History Narrative  . Not on file   Social Determinants of Health   Financial Resource Strain: Low Risk   . Difficulty of Paying Living Expenses: Not hard at all  Food Insecurity: No Food Insecurity  . Worried About Charity fundraiser in the Last Year: Never true  . Ran Out of Food in the Last Year: Never true  Transportation Needs: No Transportation Needs  . Lack of Transportation (Medical): No  . Lack of Transportation (Non-Medical): No  Physical Activity: Inactive  . Days of Exercise per Week: 0 days  . Minutes of Exercise per Session: 0 min  Stress: Stress Concern Present  . Feeling of Stress : To some extent    Social Connections: Unknown  . Frequency of Communication with Friends and Family: More than three times a week  . Frequency of Social Gatherings with Friends and Family: More than three times a week  . Attends Religious Services: Not on file  . Active Member of Clubs or Organizations: Not on file  . Attends Archivist Meetings: Not on file  . Marital Status: Married  Human resources officer Violence: Not At Risk  . Fear of Current or Ex-Partner: No  . Emotionally Abused: No  . Physically Abused: No  . Sexually Abused: No     PHYSICAL EXAM:  VS: There were no vitals taken for this visit. Physical Exam Gen: NAD, alert, cooperative with exam, well-appearing MSK:  ***      ASSESSMENT & PLAN:   No problem-specific Assessment & Plan notes found for this encounter.

## 2019-12-13 ENCOUNTER — Ambulatory Visit: Payer: Managed Care, Other (non HMO) | Admitting: Family Medicine

## 2019-12-18 ENCOUNTER — Ambulatory Visit: Payer: Managed Care, Other (non HMO) | Admitting: Family Medicine

## 2019-12-18 ENCOUNTER — Other Ambulatory Visit: Payer: Self-pay

## 2019-12-18 ENCOUNTER — Encounter: Payer: Self-pay | Admitting: Family Medicine

## 2019-12-18 DIAGNOSIS — M7711 Lateral epicondylitis, right elbow: Secondary | ICD-10-CM | POA: Diagnosis not present

## 2019-12-18 NOTE — Progress Notes (Signed)
  Terrence Hoover - 39 y.o. male MRN [id number]  Date of birth: 04-11-1981  SUBJECTIVE:  Including CC & ROS.  Chief Complaint  Patient presents with  . Follow-up    right elbow    Terrence Hoover is a 39 y.o. male that is following up for his right elbow pain.  He received a lateral epicondyle injection and has had no pain since that time.  He feels like he is back to normal.  Review of Systems See HPI   HISTORY: Past Medical, Surgical, Social, and Family History Reviewed & Updated per EMR.   Pertinent Historical Findings include:  No past medical history on file.  No past surgical history on file.  Family History  Problem Relation Age of Onset  . Hypertension Mother   . Hypertension Maternal Grandmother   . COPD Maternal Grandfather   . Cancer Maternal Grandfather   . Hypertension Maternal Grandfather   . Stroke Maternal Grandfather   . Heart attack Maternal Grandfather     Social History   Socioeconomic History  . Marital status: Married    Spouse name: Not on file  . Number of children: Not on file  . Years of education: Not on file  . Highest education level: Not on file  Occupational History  . Not on file  Tobacco Use  . Smoking status: Never Smoker  . Smokeless tobacco: Never Used  Vaping Use  . Vaping Use: Never used  Substance and Sexual Activity  . Alcohol use: No  . Drug use: Never  . Sexual activity: Yes    Partners: Female    Birth control/protection: Condom  Other Topics Concern  . Not on file  Social History Narrative  . Not on file   Social Determinants of Health   Financial Resource Strain:   . Difficulty of Paying Living Expenses:   Food Insecurity:   . Worried About Charity fundraiser in the Last Year:   . Arboriculturist in the Last Year:   Transportation Needs:   . Film/video editor (Medical):   Marland Kitchen Lack of Transportation (Non-Medical):   Physical Activity:   . Days of Exercise per Week:   . Minutes of Exercise per  Session:   Stress:   . Feeling of Stress :   Social Connections:   . Frequency of Communication with Friends and Family:   . Frequency of Social Gatherings with Friends and Family:   . Attends Religious Services:   . Active Member of Clubs or Organizations:   . Attends Archivist Meetings:   Marland Kitchen Marital Status:   Intimate Partner Violence:   . Fear of Current or Ex-Partner:   . Emotionally Abused:   Marland Kitchen Physically Abused:   . Sexually Abused:      PHYSICAL EXAM:  VS: BP 128/88   Pulse 85   Ht 5\' 11"  (1.803 m)   Wt (!) 320 lb (145.2 kg)   BMI 44.63 kg/m  Physical Exam Gen: NAD, alert, cooperative with exam, well-appearing   ASSESSMENT & PLAN:   Lateral epicondylitis of right elbow Has been pain-free since the injection. -Counseled on supportive care. -Follow-up as needed.

## 2019-12-18 NOTE — Assessment & Plan Note (Signed)
Has been pain-free since the injection. -Counseled on supportive care. -Follow-up as needed.

## 2020-01-21 ENCOUNTER — Encounter: Payer: Managed Care, Other (non HMO) | Admitting: Family Medicine

## 2020-02-05 ENCOUNTER — Other Ambulatory Visit: Payer: Self-pay

## 2020-02-06 ENCOUNTER — Encounter: Payer: Self-pay | Admitting: Family Medicine

## 2020-02-06 ENCOUNTER — Ambulatory Visit (INDEPENDENT_AMBULATORY_CARE_PROVIDER_SITE_OTHER): Payer: Managed Care, Other (non HMO) | Admitting: Family Medicine

## 2020-02-06 DIAGNOSIS — R5383 Other fatigue: Secondary | ICD-10-CM

## 2020-02-06 DIAGNOSIS — Z0000 Encounter for general adult medical examination without abnormal findings: Secondary | ICD-10-CM | POA: Diagnosis not present

## 2020-02-06 DIAGNOSIS — G4733 Obstructive sleep apnea (adult) (pediatric): Secondary | ICD-10-CM

## 2020-02-06 DIAGNOSIS — R030 Elevated blood-pressure reading, without diagnosis of hypertension: Secondary | ICD-10-CM | POA: Diagnosis not present

## 2020-02-06 DIAGNOSIS — E291 Testicular hypofunction: Secondary | ICD-10-CM

## 2020-02-06 DIAGNOSIS — E559 Vitamin D deficiency, unspecified: Secondary | ICD-10-CM

## 2020-02-06 LAB — URINALYSIS, ROUTINE W REFLEX MICROSCOPIC
Hgb urine dipstick: NEGATIVE
Ketones, ur: NEGATIVE
Leukocytes,Ua: NEGATIVE
Nitrite: NEGATIVE
RBC / HPF: NONE SEEN (ref 0–?)
Specific Gravity, Urine: >=1.03 — AB (ref 1.000–1.030)
Urine Glucose: NEGATIVE
Urobilinogen, UA: 0.2 (ref 0.0–1.0)
pH: 6 (ref 5.0–8.0)

## 2020-02-06 LAB — COMPREHENSIVE METABOLIC PANEL
ALT: 37 U/L (ref 0–53)
AST: 22 U/L (ref 0–37)
Albumin: 4.3 g/dL (ref 3.5–5.2)
Alkaline Phosphatase: 60 U/L (ref 39–117)
BUN: 8 mg/dL (ref 6–23)
CO2: 28 mEq/L (ref 19–32)
Calcium: 9.2 mg/dL (ref 8.4–10.5)
Chloride: 103 mEq/L (ref 96–112)
Creatinine, Ser: 1.08 mg/dL (ref 0.40–1.50)
GFR: 75.55 mL/min (ref >60.00)
Glucose, Bld: 128 mg/dL — ABNORMAL HIGH (ref 70–99)
Potassium: 3.9 mEq/L (ref 3.5–5.1)
Sodium: 138 mEq/L (ref 135–145)
Total Bilirubin: 0.6 mg/dL (ref 0.2–1.2)
Total Protein: 6.6 g/dL (ref 6.0–8.3)

## 2020-02-06 LAB — CBC
HCT: 47.3 % (ref 39.0–52.0)
Hemoglobin: 16.1 g/dL (ref 13.0–17.0)
MCHC: 34.1 g/dL (ref 30.0–36.0)
MCV: 90.7 fl (ref 78.0–100.0)
Platelets: 266 10*3/uL (ref 150.0–400.0)
RBC: 5.22 Mil/uL (ref 4.22–5.81)
RDW: 12.7 % (ref 11.5–15.5)
WBC: 8.3 10*3/uL (ref 4.0–10.5)

## 2020-02-06 LAB — TSH: TSH: 3.34 u[IU]/mL (ref 0.35–4.50)

## 2020-02-06 LAB — LIPID PANEL
Cholesterol: 185 mg/dL (ref 0–200)
HDL: 38 mg/dL — ABNORMAL LOW (ref >39.00)
LDL Cholesterol: 129 mg/dL — ABNORMAL HIGH (ref 0–99)
NonHDL: 146.91
Total CHOL/HDL Ratio: 5
Triglycerides: 92 mg/dL (ref 0.0–149.0)
VLDL: 18.4 mg/dL (ref 0.0–40.0)

## 2020-02-06 LAB — VITAMIN D 25 HYDROXY (VIT D DEFICIENCY, FRACTURES): VITD: 10.13 ng/mL — ABNORMAL LOW (ref 30.00–100.00)

## 2020-02-06 LAB — TESTOSTERONE: Testosterone: 229.3 ng/dL — ABNORMAL LOW (ref 300.00–890.00)

## 2020-02-06 NOTE — Progress Notes (Addendum)
Established Patient Office Visit  Subjective:  Patient ID: Terrence Hoover, male    DOB: 1980/08/28  Age: 39 y.o. MRN: [id number]  CC:  Chief Complaint  Patient presents with  . Transitions Of Care    toc from Dr. Zigmund Daniel, would like testosterone levels checked, concerns about weight,not sleeping well at night      HPI Terrence Hoover presents for his yearly physical and follow-up of his morbid obesity and fatigue.  He has had great difficulty in losing weight.  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  He has not been sleeping well.  He wakes up frequently.  He does not feel rested in the morning.  He has headaches.  He has a history of severe sleep apnea.  He is not wearing a mask or using his CPAP because he moves around at night.  Sleep doctor had suggested weight loss.  He has seen a medical nutritionist for weight loss.  He is married and has 2 children.  His libido is good.  His testosterone has been low normal in the past and he requests that it be repeated.  History reviewed. No pertinent past medical history.  History reviewed. No pertinent surgical history.  Family History  Problem Relation Age of Onset  . Hypertension Mother   . Hypertension Maternal Grandmother   . COPD Maternal Grandfather   . Cancer Maternal Grandfather   . Hypertension Maternal Grandfather   . Stroke Maternal Grandfather   . Heart attack Maternal Grandfather     Social History   Socioeconomic History  . Marital status: Married    Spouse name: Not on file  . Number of children: Not on file  . Years of education: Not on file  . Highest education level: Not on file  Occupational History  . Not on file  Tobacco Use  . Smoking status: Never Smoker  . Smokeless tobacco: Never Used  Vaping Use  . Vaping Use: Never used  Substance and Sexual Activity  . Alcohol use: No  . Drug use: Never  . Sexual  activity: Yes    Partners: Female    Birth control/protection: Condom  Other Topics Concern  . Not on file  Social History Narrative  . Not on file   Social Determinants of Health   Financial Resource Strain:   . Difficulty of Paying Living Expenses: Not on file  Food Insecurity:   . Worried About Charity fundraiser in the Last Year: Not on file  . Ran Out of Food in the Last Year: Not on file  Transportation Needs:   . Lack of Transportation (Medical): Not on file  . Lack of Transportation (Non-Medical): Not on file  Physical Activity:   . Days of Exercise per Week: Not on file  . Minutes of Exercise per Session: Not on file  Stress:   . Feeling of Stress : Not on file  Social Connections:   . Frequency of Communication with Friends and Family: Not on file  . Frequency of Social Gatherings with Friends and Family: Not on file  . Attends Religious Services: Not on file  . Active Member of Clubs or Organizations: Not on file  . Attends Archivist Meetings: Not on file  . Marital Status: Not on file  Intimate Partner Violence:   . Fear of Current or Ex-Partner: Not on  file  . Emotionally Abused: Not on file  . Physically Abused: Not on file  . Sexually Abused: Not on file    Outpatient Medications Prior to Visit  Medication Sig Dispense Refill  . MELATONIN PO Take 15 mg by mouth at bedtime as needed.    . predniSONE (DELTASONE) 5 MG tablet Take 6 pills for first day, 5 pills second day, 4 pills third day, 3 pills fourth day, 2 pills the fifth day, and 1 pill sixth day. (Patient not taking: Reported on 02/06/2020) 21 tablet 0   No facility-administered medications prior to visit.    Allergies  Allergen Reactions  . Morphine Anaphylaxis and Shortness Of Breath    "stops breathing" "stops breathing"   . Morphine And Related Anaphylaxis  . Venomil Honey Bee Venom  [Honey Bee Venom] Anaphylaxis    ROS Review of Systems  Constitutional: Positive for fatigue.   HENT: Negative.   Eyes: Negative for photophobia and visual disturbance.  Respiratory: Negative.   Cardiovascular: Negative.   Gastrointestinal: Negative.   Endocrine: Negative for polyphagia and polyuria.  Genitourinary: Negative.   Musculoskeletal: Negative for gait problem and joint swelling.  Skin: Negative for pallor and rash.  Allergic/Immunologic: Negative for immunocompromised state.  Neurological: Positive for headaches. Negative for light-headedness.  Hematological: Does not bruise/bleed easily.   Depression screen [facility] 2/9 02/06/2020 01/09/2019 11/27/2018  Decreased Interest 0 0 0  Down, Depressed, Hopeless 1 0 0  PHQ - 2 Score 1 0 0  Altered sleeping 2 - -  Tired, decreased energy 1 - -  Change in appetite 2 - -  Feeling bad or failure about yourself  0 - -  Trouble concentrating 0 - -  Moving slowly or fidgety/restless 0 - -  Suicidal thoughts 0 - -  PHQ-9 Score 6 - -  Difficult doing work/chores Not difficult at all - -      Objective:    Physical Exam Vitals and nursing note reviewed.  Constitutional:      General: He is not in acute distress.    Appearance: Normal appearance. He is obese. He is not ill-appearing, toxic-appearing or diaphoretic.  HENT:     Head: Normocephalic and atraumatic.     Right Ear: Tympanic membrane, ear canal and external ear normal.     Left Ear: Tympanic membrane, ear canal and external ear normal.     Mouth/Throat:     Mouth: Mucous membranes are moist.     Pharynx: Oropharynx is clear. No oropharyngeal exudate or posterior oropharyngeal erythema.  Eyes:     General: No scleral icterus.       Right eye: No discharge.        Left eye: No discharge.     Extraocular Movements: Extraocular movements intact.     Conjunctiva/sclera: Conjunctivae normal.     Pupils: Pupils are equal, round, and reactive to light.  Cardiovascular:     Rate and Rhythm: Normal rate and regular rhythm.  Pulmonary:     Effort: Pulmonary effort is  normal.     Breath sounds: Normal breath sounds.  Abdominal:     General: Abdomen is flat. Bowel sounds are normal. There is no distension.     Palpations: Abdomen is soft. There is no mass.     Tenderness: There is no abdominal tenderness. There is no guarding or rebound.     Hernia: No hernia is present. There is no hernia in the left inguinal area or right inguinal area.  Genitourinary:    Penis: Uncircumcised. No phimosis, paraphimosis, hypospadias, erythema, tenderness, discharge, swelling or lesions.      Testes:        Right: Mass, tenderness or swelling not present. Right testis is descended.        Left: Mass, tenderness or swelling not present. Left testis is descended.     Epididymis:     Right: Not inflamed or enlarged.     Left: Not inflamed or enlarged.  Musculoskeletal:     Cervical back: No rigidity or tenderness.  Lymphadenopathy:     Cervical: No cervical adenopathy.     Lower Body: No right inguinal adenopathy. No left inguinal adenopathy.  Skin:    General: Skin is warm and dry.     Coloration: Skin is not jaundiced.  Neurological:     Mental Status: He is alert and oriented to person, place, and time.  Psychiatric:        Mood and Affect: Mood normal.        Behavior: Behavior normal.     BP (!) 138/96   Pulse 89   Temp 97.9 F (36.6 C) (Tympanic)   Ht 5\' 11"  (1.803 m)   Wt (!) 323 lb 6.4 oz (146.7 kg)   SpO2 95%   BMI 45.11 kg/m  Wt Readings from Last 3 Encounters:  02/06/20 (!) 323 lb 6.4 oz (146.7 kg)  12/18/19 (!) 320 lb (145.2 kg)  10/25/19 (!) 320 lb (145.2 kg)     Health Maintenance Due  Topic Date Due  . Hepatitis C Screening  Never done  . HIV Screening  Never done  . INFLUENZA VACCINE  12/15/2019    There are no preventive care reminders to display for this patient.  Lab Results  Component Value Date   TSH 3.34 02/06/2020   Lab Results  Component Value Date   WBC 8.3 02/06/2020   HGB 16.1 02/06/2020   HCT 47.3 02/06/2020     MCV 90.7 02/06/2020   PLT 266.0 02/06/2020   Lab Results  Component Value Date   NA 138 02/06/2020   K 3.9 02/06/2020   CO2 28 02/06/2020   GLUCOSE 128 (H) 02/06/2020   BUN 8 02/06/2020   CREATININE 1.08 02/06/2020   BILITOT 0.6 02/06/2020   ALKPHOS 60 02/06/2020   AST 22 02/06/2020   ALT 37 02/06/2020   PROT 6.6 02/06/2020   ALBUMIN 4.3 02/06/2020   CALCIUM 9.2 02/06/2020   ANIONGAP 11 04/29/2016   GFR 75.55 02/06/2020   Lab Results  Component Value Date   CHOL 185 02/06/2020   Lab Results  Component Value Date   HDL 38.00 (L) 02/06/2020   Lab Results  Component Value Date   LDLCALC 129 (H) 02/06/2020   Lab Results  Component Value Date   TRIG 92.0 02/06/2020   Lab Results  Component Value Date   CHOLHDL 5 02/06/2020   No results found for: HGBA1C    Assessment & Plan:   Problem List Items Addressed This Visit      Respiratory   Severe obstructive sleep apnea-hypopnea syndrome   Relevant Orders   Ambulatory referral to Neurology     Endocrine   Androgen deficiency     Other   Elevated BP without diagnosis of hypertension   Morbid obesity ([place]) - Primary   Relevant Orders   VITAMIN D 25 Hydroxy (Vit-D Deficiency, Fractures) (Completed)   Amb Ref to Medical Weight Management   Healthcare maintenance   Relevant  Orders   CBC (Completed)   Comprehensive metabolic panel (Completed)   Lipid panel (Completed)   Urinalysis, Routine w reflex microscopic (Completed)   Vitamin D deficiency   Relevant Medications   Vitamin D, Ergocalciferol, (DRISDOL) 1.25 MG (50000 UNIT) CAPS capsule   Fatigue   Relevant Orders   TSH (Completed)   Testosterone (Completed)      Meds ordered this encounter  Medications  . Vitamin D, Ergocalciferol, (DRISDOL) 1.25 MG (50000 UNIT) CAPS capsule    Sig: Take 1 capsule (50,000 Units total) by mouth every 7 (seven) days.    Dispense:  5 capsule    Refill:  5    Follow-up: Return in about 3 months (around  05/07/2020).  Given information on health maintenance and disease prevention.  He was also given information on calorie counting to lose weight.  He will be referred to weight loss management.  His best bet may be bariatric surgery and I briefly discussed this with him.  He was given information on sleep apnea.  I referred him back to his sleep doctor.  Believe that many of his symptoms including fatigue headache poor sleep and elevated blood pressure are likely related to his untreated sleep apnea.  Stressed the importance to him that he use his apnea machine.  Libby Maw, MD

## 2020-02-06 NOTE — Patient Instructions (Signed)
Health Maintenance, Male Adopting a healthy lifestyle and getting preventive care are important in promoting health and wellness. Ask your health care provider about:  The right schedule for you to have regular tests and exams.  Things you can do on your own to prevent diseases and keep yourself healthy. What should I know about diet, weight, and exercise? Eat a healthy diet   Eat a diet that includes plenty of vegetables, fruits, low-fat dairy products, and lean protein.  Do not eat a lot of foods that are high in solid fats, added sugars, or sodium. Maintain a healthy weight Body mass index (BMI) is a measurement that can be used to identify possible weight problems. It estimates body fat based on height and weight. Your health care provider can help determine your BMI and help you achieve or maintain a healthy weight. Get regular exercise Get regular exercise. This is one of the most important things you can do for your health. Most adults should:  Exercise for at least 150 minutes each week. The exercise should increase your heart rate and make you sweat (moderate-intensity exercise).  Do strengthening exercises at least twice a week. This is in addition to the moderate-intensity exercise.  Spend less time sitting. Even light physical activity can be beneficial. Watch cholesterol and blood lipids Have your blood tested for lipids and cholesterol at 40 years of age, then have this test every 5 years. You may need to have your cholesterol levels checked more often if:  Your lipid or cholesterol levels are high.  You are older than 40 years of age.  You are at high risk for heart disease. What should I know about cancer screening? Many types of cancers can be detected early and may often be prevented. Depending on your health history and family history, you may need to have cancer screening at various ages. This may include screening for:  Colorectal cancer.  Prostate  cancer.  Skin cancer.  Lung cancer. What should I know about heart disease, diabetes, and high blood pressure? Blood pressure and heart disease  High blood pressure causes heart disease and increases the risk of stroke. This is more likely to develop in people who have high blood pressure readings, are of African descent, or are overweight.  Talk with your health care provider about your target blood pressure readings.  Have your blood pressure checked: ? Every 3-5 years if you are 36-104 years of age. ? Every year if you are 44 years old or older.  If you are between the ages of 59 and 38 and are a current or former smoker, ask your health care provider if you should have a one-time screening for abdominal aortic aneurysm (AAA). Diabetes Have regular diabetes screenings. This checks your fasting blood sugar level. Have the screening done:  Once every three years after age 17 if you are at a normal weight and have a low risk for diabetes.  More often and at a younger age if you are overweight or have a high risk for diabetes. What should I know about preventing infection? Hepatitis B If you have a higher risk for hepatitis B, you should be screened for this virus. Talk with your health care provider to find out if you are at risk for hepatitis B infection. Hepatitis C Blood testing is recommended for:  Everyone born from [birth year] through 1966.  Anyone with known risk factors for hepatitis C. Sexually transmitted infections (STIs)  You should be screened each year  for STIs, including gonorrhea and chlamydia, if: ? You are sexually active and are younger than 40 years of age. ? You are older than 40 years of age and your health care provider tells you that you are at risk for this type of infection. ? Your sexual activity has changed since you were last screened, and you are at increased risk for chlamydia or gonorrhea. Ask your health care provider if you are at risk.  Ask your  health care provider about whether you are at high risk for HIV. Your health care provider may recommend a prescription medicine to help prevent HIV infection. If you choose to take medicine to prevent HIV, you should first get tested for HIV. You should then be tested every 3 months for as long as you are taking the medicine. Follow these instructions at home: Lifestyle  Do not use any products that contain nicotine or tobacco, such as cigarettes, e-cigarettes, and chewing tobacco. If you need help quitting, ask your health care provider.  Do not use street drugs.  Do not share needles.  Ask your health care provider for help if you need support or information about quitting drugs. Alcohol use  Do not drink alcohol if your health care provider tells you not to drink.  If you drink alcohol: ? Limit how much you have to 0-2 drinks a day. ? Be aware of how much alcohol is in your drink. In the [place] one drink equals one 12 oz bottle of beer (355 mL), one 5 oz glass of wine (148 mL), or one 1 oz glass of hard liquor (44 mL). General instructions  Schedule regular health, dental, and eye exams.  Stay current with your vaccines.  Tell your health care provider if: ? You often feel depressed. ? You have ever been abused or do not feel safe at home. Summary  Adopting a healthy lifestyle and getting preventive care are important in promoting health and wellness.  Follow your health care provider's instructions about healthy diet, exercising, and getting tested or screened for diseases.  Follow your health care provider's instructions on monitoring your cholesterol and blood pressure. This information is not intended to replace advice given to you by your health care provider. Make sure you discuss any questions you have with your health care provider. Document Revised: 04/25/2018 Document Reviewed: 04/25/2018 Elsevier Patient Education  2020 [place] Years  Old, Male Preventive care refers to lifestyle choices and visits with your health care provider that can promote health and wellness. This includes:  A yearly physical exam. This is also called an annual well check.  Regular dental and eye exams.  Immunizations.  Screening for certain conditions.  Healthy lifestyle choices, such as eating a healthy diet, getting regular exercise, not using drugs or products that contain nicotine and tobacco, and limiting alcohol use. What can I expect for my preventive care visit? Physical exam Your health care provider will check:  Height and weight. These may be used to calculate body mass index (BMI), which is a measurement that tells if you are at a healthy weight.  Heart rate and blood pressure.  Your skin for abnormal spots. Counseling Your health care provider may ask you questions about:  Alcohol, tobacco, and drug use.  Emotional well-being.  Home and relationship well-being.  Sexual activity.  Eating habits.  Work and work Statistician. What immunizations do I need?  Influenza (flu) vaccine  This is recommended every year. Tetanus, diphtheria,  and pertussis (Tdap) vaccine  You may need a Td booster every 10 years. Varicella (chickenpox) vaccine  You may need this vaccine if you have not already been vaccinated. Zoster (shingles) vaccine  You may need this after age 63. Measles, mumps, and rubella (MMR) vaccine  You may need at least one dose of MMR if you were born in 1957 or later. You may also need a second dose. Pneumococcal conjugate (PCV13) vaccine  You may need this if you have certain conditions and were not previously vaccinated. Pneumococcal polysaccharide (PPSV23) vaccine  You may need one or two doses if you smoke cigarettes or if you have certain conditions. Meningococcal conjugate (MenACWY) vaccine  You may need this if you have certain conditions. Hepatitis A vaccine  You may need this if you have  certain conditions or if you travel or work in places where you may be exposed to hepatitis A. Hepatitis B vaccine  You may need this if you have certain conditions or if you travel or work in places where you may be exposed to hepatitis B. Haemophilus influenzae type b (Hib) vaccine  You may need this if you have certain risk factors. Human papillomavirus (HPV) vaccine  If recommended by your health care provider, you may need three doses over 6 months. You may receive vaccines as individual doses or as more than one vaccine together in one shot (combination vaccines). Talk with your health care provider about the risks and benefits of combination vaccines. What tests do I need? Blood tests  Lipid and cholesterol levels. These may be checked every 5 years, or more frequently if you are over 68 years old.  Hepatitis C test.  Hepatitis B test. Screening  Lung cancer screening. You may have this screening every year starting at age 78 if you have a 30-pack-year history of smoking and currently smoke or have quit within the past 15 years.  Prostate cancer screening. Recommendations will vary depending on your family history and other risks.  Colorectal cancer screening. All adults should have this screening starting at age 38 and continuing until age 22. Your health care provider may recommend screening at age 73 if you are at increased risk. You will have tests every 1-10 years, depending on your results and the type of screening test.  Diabetes screening. This is done by checking your blood sugar (glucose) after you have not eaten for a while (fasting). You may have this done every 1-3 years.  Sexually transmitted disease (STD) testing. Follow these instructions at home: Eating and drinking  Eat a diet that includes fresh fruits and vegetables, whole grains, lean protein, and low-fat dairy products.  Take vitamin and mineral supplements as recommended by your health care  provider.  Do not drink alcohol if your health care provider tells you not to drink.  If you drink alcohol: ? Limit how much you have to 0-2 drinks a day. ? Be aware of how much alcohol is in your drink. In the [place] one drink equals one 12 oz bottle of beer (355 mL), one 5 oz glass of wine (148 mL), or one 1 oz glass of hard liquor (44 mL). Lifestyle  Take daily care of your teeth and gums.  Stay active. Exercise for at least 30 minutes on 5 or more days each week.  Do not use any products that contain nicotine or tobacco, such as cigarettes, e-cigarettes, and chewing tobacco. If you need help quitting, ask your health care provider.  If  you are sexually active, practice safe sex. Use a condom or other form of protection to prevent STIs (sexually transmitted infections).  Talk with your health care provider about taking a low-dose aspirin every day starting at age 98. What's next?  Go to your health care provider once a year for a well check visit.  Ask your health care provider how often you should have your eyes and teeth checked.  Stay up to date on all vaccines. This information is not intended to replace advice given to you by your health care provider. Make sure you discuss any questions you have with your health care provider. Document Revised: 04/26/2018 Document Reviewed: 04/26/2018 Elsevier Patient Education  [place].  Sleep Apnea Sleep apnea is a condition in which breathing pauses or becomes shallow during sleep. Episodes of sleep apnea usually last 10 seconds or longer, and they may occur as many as 20 times an hour. Sleep apnea disrupts your sleep and keeps your body from getting the rest that it needs. This condition can increase your risk of certain health problems, including:  Heart attack.  Stroke.  Obesity.  Diabetes.  Heart failure.  Irregular heartbeat. What are the causes? There are three kinds of sleep apnea:  Obstructive sleep apnea.  This kind is caused by a blocked or collapsed airway.  Central sleep apnea. This kind happens when the part of the brain that controls breathing does not send the correct signals to the muscles that control breathing.  Mixed sleep apnea. This is a combination of obstructive and central sleep apnea. The most common cause of this condition is a collapsed or blocked airway. An airway can collapse or become blocked if:  Your throat muscles are abnormally relaxed.  Your tongue and tonsils are larger than normal.  You are overweight.  Your airway is smaller than normal. What increases the risk? You are more likely to develop this condition if you:  Are overweight.  Smoke.  Have a smaller than normal airway.  Are elderly.  Are male.  Drink alcohol.  Take sedatives or tranquilizers.  Have a family history of sleep apnea. What are the signs or symptoms? Symptoms of this condition include:  Trouble staying asleep.  Daytime sleepiness and tiredness.  Irritability.  Loud snoring.  Morning headaches.  Trouble concentrating.  Forgetfulness.  Decreased interest in sex.  Unexplained sleepiness.  Mood swings.  Personality changes.  Feelings of depression.  Waking up often during the night to urinate.  Dry mouth.  Sore throat. How is this diagnosed? This condition may be diagnosed with:  A medical history.  A physical exam.  A series of tests that are done while you are sleeping (sleep study). These tests are usually done in a sleep lab, but they may also be done at home. How is this treated? Treatment for this condition aims to restore normal breathing and to ease symptoms during sleep. It may involve managing health issues that can affect breathing, such as high blood pressure or obesity. Treatment may include:  Sleeping on your side.  Using a decongestant if you have nasal congestion.  Avoiding the use of depressants, including alcohol, sedatives, and  narcotics.  Losing weight if you are overweight.  Making changes to your diet.  Quitting smoking.  Using a device to open your airway while you sleep, such as: ? An oral appliance. This is a custom-made mouthpiece that shifts your lower jaw forward. ? A continuous positive airway pressure (CPAP) device. This device  blows air through a mask when you breathe out (exhale). ? A nasal expiratory positive airway pressure (EPAP) device. This device has valves that you put into each nostril. ? A bi-level positive airway pressure (BPAP) device. This device blows air through a mask when you breathe in (inhale) and breathe out (exhale).  Having surgery if other treatments do not work. During surgery, excess tissue is removed to create a wider airway. It is important to get treatment for sleep apnea. Without treatment, this condition can lead to:  High blood pressure.  Coronary artery disease.  In men, an inability to achieve or maintain an erection (impotence).  Reduced thinking abilities. Follow these instructions at home: Lifestyle  Make any lifestyle changes that your health care provider recommends.  Eat a healthy, well-balanced diet.  Take steps to lose weight if you are overweight.  Avoid using depressants, including alcohol, sedatives, and narcotics.  Do not use any products that contain nicotine or tobacco, such as cigarettes, e-cigarettes, and chewing tobacco. If you need help quitting, ask your health care provider. General instructions  Take over-the-counter and prescription medicines only as told by your health care provider.  If you were given a device to open your airway while you sleep, use it only as told by your health care provider.  If you are having surgery, make sure to tell your health care provider you have sleep apnea. You may need to bring your device with you.  Keep all follow-up visits as told by your health care provider. This is important. Contact a  health care provider if:  The device that you received to open your airway during sleep is uncomfortable or does not seem to be working.  Your symptoms do not improve.  Your symptoms get worse. Get help right away if:  You develop: ? Chest pain. ? Shortness of breath. ? Discomfort in your back, arms, or stomach.  You have: ? Trouble speaking. ? Weakness on one side of your body. ? Drooping in your face. These symptoms may represent a serious problem that is an emergency. Do not wait to see if the symptoms will go away. Get medical help right away. Call your local emergency services (911 in the [place] Do not drive yourself to the hospital. Summary  Sleep apnea is a condition in which breathing pauses or becomes shallow during sleep.  The most common cause is a collapsed or blocked airway.  The goal of treatment is to restore normal breathing and to ease symptoms during sleep. This information is not intended to replace advice given to you by your health care provider. Make sure you discuss any questions you have with your health care provider. Document Revised: 10/17/2018 Document Reviewed: 12/26/2017 Elsevier Patient Education  2020 [place] [employer] Calories are units of energy. Your body needs a certain amount of calories from food to keep you going throughout the day. When you eat more calories than your body needs, your body stores the extra calories as fat. When you eat fewer calories than your body needs, your body burns fat to get the energy it needs. Calorie counting means keeping track of how many calories you eat and drink each day. Calorie counting can be helpful if you need to lose weight. If you make sure to eat fewer calories than your body needs, you should lose weight. Ask your health care provider what a healthy weight is for you. For calorie counting to work, you will need  to eat the right number of calories in a day in order to lose a  healthy amount of weight per week. A dietitian can help you determine how many calories you need in a day and will give you suggestions on how to reach your calorie goal.  A healthy amount of weight to lose per week is usually 1-2 lb (0.5-0.9 kg). This usually means that your daily calorie intake should be reduced by 500-750 calories.  Eating 1,200 - 1,500 calories per day can help most women lose weight.  Eating 1,500 - 1,800 calories per day can help most men lose weight. What is my plan? My goal is to have __________ calories per day. If I have this many calories per day, I should lose around __________ pounds per week. What do I need to know about calorie counting? In order to meet your daily calorie goal, you will need to:  Find out how many calories are in each food you would like to eat. Try to do this before you eat.  Decide how much of the food you plan to eat.  Write down what you ate and how many calories it had. Doing this is called keeping a food log. To successfully lose weight, it is important to balance calorie counting with a healthy lifestyle that includes regular activity. Aim for 150 minutes of moderate exercise (such as walking) or 75 minutes of vigorous exercise (such as running) each week. Where do I find calorie information?  The number of calories in a food can be found on a Nutrition Facts label. If a food does not have a Nutrition Facts label, try to look up the calories online or ask your dietitian for help. Remember that calories are listed per serving. If you choose to have more than one serving of a food, you will have to multiply the calories per serving by the amount of servings you plan to eat. For example, the label on a package of bread might say that a serving size is 1 slice and that there are 90 calories in a serving. If you eat 1 slice, you will have eaten 90 calories. If you eat 2 slices, you will have eaten 180 calories. How do I keep a food  log? Immediately after each meal, record the following information in your food log:  What you ate. Don't forget to include toppings, sauces, and other extras on the food.  How much you ate. This can be measured in cups, ounces, or number of items.  How many calories each food and drink had.  The total number of calories in the meal. Keep your food log near you, such as in a small notebook in your pocket, or use a mobile app or website. Some programs will calculate calories for you and show you how many calories you have left for the day to meet your goal. What are some calorie counting tips?   Use your calories on foods and drinks that will fill you up and not leave you hungry: ? Some examples of foods that fill you up are nuts and nut butters, vegetables, lean proteins, and high-fiber foods like whole grains. High-fiber foods are foods with more than 5 g fiber per serving. ? Drinks such as sodas, specialty coffee drinks, alcohol, and juices have a lot of calories, yet do not fill you up.  Eat nutritious foods and avoid empty calories. Empty calories are calories you get from foods or beverages that do not have many  vitamins or protein, such as candy, sweets, and soda. It is better to have a nutritious high-calorie food (such as an avocado) than a food with few nutrients (such as a bag of chips).  Know how many calories are in the foods you eat most often. This will help you calculate calorie counts faster.  Pay attention to calories in drinks. Low-calorie drinks include water and unsweetened drinks.  Pay attention to nutrition labels for "low fat" or "fat free" foods. These foods sometimes have the same amount of calories or more calories than the full fat versions. They also often have added sugar, starch, or salt, to make up for flavor that was removed with the fat.  Find a way of tracking calories that works for you. Get creative. Try different apps or programs if writing down calories  does not work for you. What are some portion control tips?  Know how many calories are in a serving. This will help you know how many servings of a certain food you can have.  Use a measuring cup to measure serving sizes. You could also try weighing out portions on a kitchen scale. With time, you will be able to estimate serving sizes for some foods.  Take some time to put servings of different foods on your favorite plates, bowls, and cups so you know what a serving looks like.  Try not to eat straight from a bag or box. Doing this can lead to overeating. Put the amount you would like to eat in a cup or on a plate to make sure you are eating the right portion.  Use smaller plates, glasses, and bowls to prevent overeating.  Try not to multitask (for example, watch TV or use your computer) while eating. If it is time to eat, sit down at a table and enjoy your food. This will help you to know when you are full. It will also help you to be aware of what you are eating and how much you are eating. What are tips for following this plan? Reading food labels  Check the calorie count compared to the serving size. The serving size may be smaller than what you are used to eating.  Check the source of the calories. Make sure the food you are eating is high in vitamins and protein and low in saturated and trans fats. Shopping  Read nutrition labels while you shop. This will help you make healthy decisions before you decide to purchase your food.  Make a grocery list and stick to it. Cooking  Try to cook your favorite foods in a healthier way. For example, try baking instead of frying.  Use low-fat dairy products. Meal planning  Use more fruits and vegetables. Half of your plate should be fruits and vegetables.  Include lean proteins like poultry and fish. How do I count calories when eating out?  Ask for smaller portion sizes.  Consider sharing an entree and sides instead of getting your  own entree.  If you get your own entree, eat only half. Ask for a box at the beginning of your meal and put the rest of your entree in it so you are not tempted to eat it.  If calories are listed on the menu, choose the lower calorie options.  Choose dishes that include vegetables, fruits, whole grains, low-fat dairy products, and lean protein.  Choose items that are boiled, broiled, grilled, or steamed. Stay away from items that are buttered, battered, fried, or served with  cream sauce. Items labeled "crispy" are usually fried, unless stated otherwise.  Choose water, low-fat milk, unsweetened iced tea, or other drinks without added sugar. If you want an alcoholic beverage, choose a lower calorie option such as a glass of wine or light beer.  Ask for dressings, sauces, and syrups on the side. These are usually high in calories, so you should limit the amount you eat.  If you want a salad, choose a garden salad and ask for grilled meats. Avoid extra toppings like bacon, cheese, or fried items. Ask for the dressing on the side, or ask for olive oil and vinegar or lemon to use as dressing.  Estimate how many servings of a food you are given. For example, a serving of cooked rice is  cup or about the size of half a baseball. Knowing serving sizes will help you be aware of how much food you are eating at restaurants. The list below tells you how big or small some common portion sizes are based on everyday objects: ? 1 oz--4 stacked dice. ? 3 oz--1 deck of cards. ? 1 tsp--1 die. ? 1 Tbsp-- a ping-pong ball. ? 2 Tbsp--1 ping-pong ball. ?  cup-- baseball. ? 1 cup--1 baseball. Summary  Calorie counting means keeping track of how many calories you eat and drink each day. If you eat fewer calories than your body needs, you should lose weight.  A healthy amount of weight to lose per week is usually 1-2 lb (0.5-0.9 kg). This usually means reducing your daily calorie intake by 500-750  calories.  The number of calories in a food can be found on a Nutrition Facts label. If a food does not have a Nutrition Facts label, try to look up the calories online or ask your dietitian for help.  Use your calories on foods and drinks that will fill you up, and not on foods and drinks that will leave you hungry.  Use smaller plates, glasses, and bowls to prevent overeating. This information is not intended to replace advice given to you by your health care provider. Make sure you discuss any questions you have with your health care provider. Document Revised: 01/19/2018 Document Reviewed: 04/01/2016 Elsevier Patient Education  [place].

## 2020-02-07 DIAGNOSIS — E291 Testicular hypofunction: Secondary | ICD-10-CM | POA: Insufficient documentation

## 2020-02-07 DIAGNOSIS — R5383 Other fatigue: Secondary | ICD-10-CM | POA: Insufficient documentation

## 2020-02-07 DIAGNOSIS — E559 Vitamin D deficiency, unspecified: Secondary | ICD-10-CM | POA: Insufficient documentation

## 2020-02-07 MED ORDER — VITAMIN D (ERGOCALCIFEROL) 1.25 MG (50000 UNIT) PO CAPS: 50000.0000 [IU] | ORAL_CAPSULE | ORAL | 5 refills | Status: DC

## 2020-02-07 NOTE — Addendum Note (Signed)
Addended by: Jon Billings on: 02/07/2020 07:57 AM   Modules accepted: Orders

## 2020-02-10 ENCOUNTER — Telehealth (INDEPENDENT_AMBULATORY_CARE_PROVIDER_SITE_OTHER): Payer: Managed Care, Other (non HMO) | Admitting: Family Medicine

## 2020-02-10 ENCOUNTER — Encounter: Payer: Self-pay | Admitting: Family Medicine

## 2020-02-10 VITALS — Temp 97.4°F | Ht 71.0 in | Wt 323.0 lb

## 2020-02-10 DIAGNOSIS — Z6841 Body Mass Index (BMI) 40.0 and over, adult: Secondary | ICD-10-CM | POA: Diagnosis not present

## 2020-02-10 DIAGNOSIS — E559 Vitamin D deficiency, unspecified: Secondary | ICD-10-CM | POA: Diagnosis not present

## 2020-02-10 MED ORDER — TESTOSTERONE CYPIONATE 200 MG/ML IM SOLN
200.0000 mg | INTRAMUSCULAR | 1 refills | Status: DC
Start: 2020-02-10 — End: 2022-08-03

## 2020-02-10 MED ORDER — TESTOSTERONE CYPIONATE 200 MG/ML IM SOLN: 200.0000 mg | INTRAMUSCULAR | 1 refills | Status: DC

## 2020-02-10 NOTE — Progress Notes (Addendum)
Established Patient Office Visit  Subjective:  Patient ID: Terrence Hoover, male    DOB: 1981-01-04  Age: 39 y.o. MRN: [id number]  CC:  Chief Complaint  Patient presents with   Advice Only    discuss labs     HPI Terrence Hoover presents for follow-up of vitamin D deficiency, androgen deficiency, elevated glucose, morbid obesity.  History reviewed. No pertinent past medical history.  History reviewed. No pertinent surgical history.  Family History  Problem Relation Age of Onset   Hypertension Mother    Hypertension Maternal Grandmother    COPD Maternal Grandfather    Cancer Maternal Grandfather    Hypertension Maternal Grandfather    Stroke Maternal Grandfather    Heart attack Maternal Grandfather     Social History   Socioeconomic History   Marital status: Married    Spouse name: Not on file   Number of children: Not on file   Years of education: Not on file   Highest education level: Not on file  Occupational History   Not on file  Tobacco Use   Smoking status: Never Smoker   Smokeless tobacco: Never Used  Vaping Use   Vaping Use: Never used  Substance and Sexual Activity   Alcohol use: No   Drug use: Never   Sexual activity: Yes    Partners: Female    Birth control/protection: Condom  Other Topics Concern   Not on file  Social History Narrative   Not on file   Social Determinants of Health   Financial Resource Strain:    Difficulty of Paying Living Expenses: Not on file  Food Insecurity:    Worried About Charity fundraiser in the Last Year: Not on file   [employer] of Food in the Last Year: Not on file  Transportation Needs:    Lack of Transportation (Medical): Not on file   Lack of Transportation (Non-Medical): Not on file  Physical Activity:    Days of Exercise per Week: Not on file   Minutes of Exercise per Session: Not on file  Stress:    Feeling of Stress : Not on file  Social Connections:    Frequency of  Communication with Friends and Family: Not on file   Frequency of Social Gatherings with Friends and Family: Not on file   Attends Religious Services: Not on file   Active Member of Clubs or Organizations: Not on file   Attends Archivist Meetings: Not on file   Marital Status: Not on file  Intimate Partner Violence:    Fear of Current or Ex-Partner: Not on file   Emotionally Abused: Not on file   Physically Abused: Not on file   Sexually Abused: Not on file    Outpatient Medications Prior to Visit  Medication Sig Dispense Refill   MELATONIN PO Take 15 mg by mouth at bedtime as needed.     Vitamin D, Ergocalciferol, (DRISDOL) 1.25 MG (50000 UNIT) CAPS capsule Take 1 capsule (50,000 Units total) by mouth every 7 (seven) days. 5 capsule 5   predniSONE (DELTASONE) 5 MG tablet Take 6 pills for first day, 5 pills second day, 4 pills third day, 3 pills fourth day, 2 pills the fifth day, and 1 pill sixth day. (Patient not taking: Reported on 02/06/2020) 21 tablet 0   No facility-administered medications prior to visit.    Allergies  Allergen Reactions   Morphine Anaphylaxis and Shortness Of Breath    "stops breathing" "stops breathing"  Morphine And Related Anaphylaxis   Venomil Honey Bee Venom  [Honey Bee Venom] Anaphylaxis    ROS Review of Systems  Constitutional: Negative.   Respiratory: Negative.   Cardiovascular: Negative.   Gastrointestinal: Negative.       Objective:    Physical Exam Vitals and nursing note reviewed.  Constitutional:      Appearance: Normal appearance. He is obese.  Eyes:     General: No scleral icterus.       Right eye: No discharge.        Left eye: No discharge.     Conjunctiva/sclera: Conjunctivae normal.  Pulmonary:     Effort: Pulmonary effort is normal.  Neurological:     Mental Status: He is alert and oriented to person, place, and time.  Psychiatric:        Mood and Affect: Mood normal.        Behavior:  Behavior normal.     Temp (!) 97.4 F (36.3 C) (Tympanic)    Ht 5\' 11"  (1.803 m)    Wt (!) 323 lb (146.5 kg)    BMI 45.05 kg/m  Wt Readings from Last 3 Encounters:  02/10/20 (!) 323 lb (146.5 kg)  02/06/20 (!) 323 lb 6.4 oz (146.7 kg)  12/18/19 (!) 320 lb (145.2 kg)     Health Maintenance Due  Topic Date Due   Hepatitis C Screening  Never done   HIV Screening  Never done   INFLUENZA VACCINE  12/15/2019    There are no preventive care reminders to display for this patient.  Lab Results  Component Value Date   TSH 3.34 02/06/2020   Lab Results  Component Value Date   WBC 8.3 02/06/2020   HGB 16.1 02/06/2020   HCT 47.3 02/06/2020   MCV 90.7 02/06/2020   PLT 266.0 02/06/2020   Lab Results  Component Value Date   NA 138 02/06/2020   K 3.9 02/06/2020   CO2 28 02/06/2020   GLUCOSE 128 (H) 02/06/2020   BUN 8 02/06/2020   CREATININE 1.08 02/06/2020   BILITOT 0.6 02/06/2020   ALKPHOS 60 02/06/2020   AST 22 02/06/2020   ALT 37 02/06/2020   PROT 6.6 02/06/2020   ALBUMIN 4.3 02/06/2020   CALCIUM 9.2 02/06/2020   ANIONGAP 11 04/29/2016   GFR 75.55 02/06/2020   Lab Results  Component Value Date   CHOL 185 02/06/2020   Lab Results  Component Value Date   HDL 38.00 (L) 02/06/2020   Lab Results  Component Value Date   LDLCALC 129 (H) 02/06/2020   Lab Results  Component Value Date   TRIG 92.0 02/06/2020   Lab Results  Component Value Date   CHOLHDL 5 02/06/2020   No results found for: HGBA1C    Assessment & Plan:   Problem List Items Addressed This Visit      Other   Morbid obesity with body mass index (BMI) of 45.0 to 49.9 in adult [facility]) - Primary   Relevant Orders   Ambulatory referral to General Surgery   Vitamin D deficiency   Relevant Medications   Vitamin D, Ergocalciferol, (DRISDOL) 1.25 MG (50000 UNIT) CAPS capsule      Meds ordered this encounter  Medications   DISCONTD: testosterone cypionate (DEPOTESTOSTERONE CYPIONATE) 200 MG/ML  injection    Sig: Inject 1 mL (200 mg total) into the muscle every 14 (fourteen) days.    Dispense:  10 mL    Refill:  1   testosterone cypionate (DEPOTESTOSTERONE CYPIONATE)  200 MG/ML injection    Sig: Inject 1 mL (200 mg total) into the muscle every 14 (fourteen) days.    Dispense:  10 mL    Refill:  1   Vitamin D, Ergocalciferol, (DRISDOL) 1.25 MG (50000 UNIT) CAPS capsule    Sig: Take 1 capsule (50,000 Units total) by mouth every 7 (seven) days.    Dispense:  5 capsule    Refill:  6    Follow-up: Return for scheduled appt in December..   Patient schedule a nurse appointment for testosterone injection.  Suggested that he become a [employer] donor.  He agrees to go for a consultation for bariatric surgery.  He is aware that he is most likely a borderline diabetic and that weight loss could make a major difference in preventing diabetes.  Encouraged him again to follow-up with sleep medicine to find a mask that can work for him. Libby Maw, MD   Virtual Visit via Video Note  I connected with Terrence Hoover on 02/12/20 at 10:30 AM EDT by a video enabled telemedicine application and verified that I am speaking with the correct person using two identifiers.  Location: Patient: At home with WIFE. Provider:    I discussed the limitations of evaluation and management by telemedicine and the availability of in person appointments. The patient expressed understanding and agreed to proceed.  History of Present Illness:    Observations/Objective:   Assessment and Plan:   Follow Up Instructions:    I discussed the assessment and treatment plan with the patient. The patient was provided an opportunity to ask questions and all were answered. The patient agreed with the plan and demonstrated an understanding of the instructions.   The patient was advised to call back or seek an in-person evaluation if the symptoms worsen or if the condition fails to improve as  anticipated.  I provided 25 minutes of non-face-to-face time during this encounter.   Libby Maw, MD

## 2020-02-11 ENCOUNTER — Telehealth: Payer: Self-pay | Admitting: Family Medicine

## 2020-02-11 ENCOUNTER — Other Ambulatory Visit: Payer: Self-pay

## 2020-02-11 DIAGNOSIS — E559 Vitamin D deficiency, unspecified: Secondary | ICD-10-CM

## 2020-02-11 MED ORDER — VITAMIN D (ERGOCALCIFEROL) 1.25 MG (50000 UNIT) PO CAPS: 50000.0000 [IU] | ORAL_CAPSULE | ORAL | 5 refills | Status: DC

## 2020-02-11 NOTE — Telephone Encounter (Signed)
Patient spouse called and stated that the pharmacy still haven't received his prescriptions for testosterone and the vitamin d, please advise. CB is [phone]

## 2020-02-12 ENCOUNTER — Encounter: Payer: Self-pay | Admitting: Family Medicine

## 2020-02-12 MED ORDER — VITAMIN D (ERGOCALCIFEROL) 1.25 MG (50000 UNIT) PO CAPS: 50000.0000 [IU] | ORAL_CAPSULE | ORAL | 6 refills | Status: DC

## 2020-02-12 NOTE — Addendum Note (Signed)
Addended by: Jon Billings on: 02/12/2020 09:41 AM   Modules accepted: Orders

## 2020-02-19 ENCOUNTER — Other Ambulatory Visit: Payer: Self-pay

## 2020-02-20 ENCOUNTER — Ambulatory Visit (INDEPENDENT_AMBULATORY_CARE_PROVIDER_SITE_OTHER): Payer: Managed Care, Other (non HMO)

## 2020-02-20 DIAGNOSIS — E291 Testicular hypofunction: Secondary | ICD-10-CM

## 2020-02-20 MED ORDER — TESTOSTERONE CYPIONATE 200 MG/ML IM SOLN
200.0000 mg | INTRAMUSCULAR | Status: DC
  Administered 2020-02-20: 200 mg via INTRAMUSCULAR

## 2020-02-20 NOTE — Progress Notes (Signed)
Pt came in per Dr. Bebe Shaggy orders to receive his first initial Testosterone injection. Pt an his wife came in to learn how to give testosterone shots. Instructions was given and any questions was answered. Pt received injection in the LUQ an tolerated injection well. Pt was instructed to stay in office for 15-20 min.  Dr. Ethelene Hal Pt is asking for syringes to be sent to the pharmacy so that he can inject himself.

## 2020-03-05 ENCOUNTER — Telehealth: Payer: Self-pay | Admitting: Family Medicine

## 2020-03-05 NOTE — Telephone Encounter (Signed)
Patients wife dropped off forms (Medical History Statement) to be completed by provider. Placed in providers folder in front office for pick up.

## 2020-03-06 DIAGNOSIS — Z0279 Encounter for issue of other medical certificate: Secondary | ICD-10-CM

## 2020-03-06 NOTE — Telephone Encounter (Signed)
Forms filled ut patient aware to pick up.

## 2020-04-14 ENCOUNTER — Telehealth: Payer: Self-pay | Admitting: Neurology

## 2020-04-14 NOTE — Telephone Encounter (Signed)
Patient has not contacted D[facility] [employer] back, has not started CPAP ( multiple reminder calls) and is scheduled again with me for follow up tomorrow. Is there a new interest to finally start therapy?    Larey Seat, MD    Patient no show for RV in sleep clinic on 03-20-2019,   March 20, 2019 Darleen Crocker, RN   2:33 PM Note Pt was a no show to the apt for today    March 18, 2019 Darleen Crocker, RN    2:00 PM Note Called to inform the patient that I had him scheduled for a initial cpap visit on 11/4. Per [employer] the patient has not started the machine and that was the main purpose of the visit was to look at compliance download from initial CPAP. LVM informing the patient to call back if he would like to cancel the apt for wed to avoid any late fee, I advised our office will be closed on 11/3 and informed us to let us know. Informed if he wanted to come in that is fine too. Just wanted to give the options        March 13, 2019 Darleen Crocker, RN   1:46 PM Note   Received a e-mail from [employer] stating the pt has not yet started his machine. The pt will have to reach out to [employer] when he is ready to schedule.   "Just letting you know that this patient was scheduled for 9/24. He was a no show. I have since called him on 10/5, 10/6, and 10/12. No answer LVM. Email was also sent on 10/12. I am going to void this order."     January 30, 2019 Darleen Crocker, RN   2:57 PM Note   I have sent this information to [employer] and they are the ones that will send this information to the patient insurance company. If they call back please defer to [employer] [phone]

## 2020-04-15 ENCOUNTER — Ambulatory Visit: Payer: Managed Care, Other (non HMO) | Admitting: Neurology

## 2020-04-15 ENCOUNTER — Telehealth: Payer: Self-pay | Admitting: Neurology

## 2020-04-15 NOTE — Telephone Encounter (Signed)
This is a repeat no show for this patient. Please dismiss.   FYI to PCP.   Larey Seat, MD

## 2020-04-16 ENCOUNTER — Encounter: Payer: Self-pay | Admitting: Neurology

## 2020-05-11 ENCOUNTER — Ambulatory Visit: Payer: Managed Care, Other (non HMO) | Admitting: Family Medicine

## 2020-06-10 ENCOUNTER — Telehealth: Payer: Self-pay | Admitting: Family Medicine

## 2020-06-10 NOTE — Telephone Encounter (Signed)
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 Please call her at [phone] if you have any questions.

## 2020-06-11 NOTE — Telephone Encounter (Signed)
Wife called back again. Please call at [phone].

## 2020-06-11 NOTE — Telephone Encounter (Signed)
Spoke with patients wife who states that they just need a copy of patients last OV with Dr. Ethelene Hal. Printed off for pick up.

## 2020-12-15 ENCOUNTER — Telehealth: Payer: Self-pay | Admitting: Family Medicine

## 2020-12-15 NOTE — Telephone Encounter (Signed)
Pt requesting TOC from DR Ethelene Hal to Dr Gena Fray, Is this okay?

## 2020-12-21 NOTE — Telephone Encounter (Signed)
Scheduled appointment

## 2020-12-21 NOTE — Telephone Encounter (Signed)
Lvm for pt to call back and schedule.  

## 2021-02-04 ENCOUNTER — Other Ambulatory Visit: Payer: Self-pay | Admitting: Chiropractic Medicine

## 2021-02-04 ENCOUNTER — Other Ambulatory Visit: Payer: Self-pay

## 2021-02-04 ENCOUNTER — Ambulatory Visit
Admission: RE | Admit: 2021-02-04 | Discharge: 2021-02-04 | Disposition: A | Discharge status: Home / Self Care | Payer: Managed Care, Other (non HMO) | Source: Ambulatory Visit | Attending: Chiropractic Medicine | Admitting: Chiropractic Medicine

## 2021-02-04 DIAGNOSIS — R52 Pain, unspecified: Secondary | ICD-10-CM

## 2021-02-19 ENCOUNTER — Encounter: Payer: Self-pay | Admitting: Family Medicine

## 2021-05-12 ENCOUNTER — Encounter: Payer: Managed Care, Other (non HMO) | Admitting: Family Medicine

## 2021-07-14 ENCOUNTER — Other Ambulatory Visit: Payer: Self-pay | Admitting: Chiropractic Medicine

## 2021-07-14 DIAGNOSIS — M545 Low back pain, unspecified: Secondary | ICD-10-CM

## 2021-07-14 DIAGNOSIS — G8929 Other chronic pain: Secondary | ICD-10-CM

## 2021-07-15 ENCOUNTER — Other Ambulatory Visit: Payer: Self-pay

## 2021-07-16 ENCOUNTER — Ambulatory Visit (INDEPENDENT_AMBULATORY_CARE_PROVIDER_SITE_OTHER): Payer: PRIVATE HEALTH INSURANCE | Admitting: Nurse Practitioner

## 2021-07-16 ENCOUNTER — Encounter: Payer: Self-pay | Admitting: Nurse Practitioner

## 2021-07-16 VITALS — BP 148/120 | HR 112 | Temp 97.1°F | Wt 298.0 lb

## 2021-07-16 DIAGNOSIS — G8929 Other chronic pain: Secondary | ICD-10-CM

## 2021-07-16 DIAGNOSIS — M5441 Lumbago with sciatica, right side: Secondary | ICD-10-CM | POA: Diagnosis not present

## 2021-07-16 MED ORDER — CYCLOBENZAPRINE HCL 5 MG PO TABS: 5.0000 mg | ORAL_TABLET | Freq: Three times a day (TID) | ORAL | 1 refills | Status: DC | PRN

## 2021-07-16 MED ORDER — KETOROLAC TROMETHAMINE 60 MG/2ML IM SOLN
30.0000 mg | Freq: Once | INTRAMUSCULAR | Status: AC
  Administered 2021-07-16: 30 mg via INTRAMUSCULAR

## 2021-07-16 MED ORDER — GABAPENTIN 300 MG PO CAPS: 300.0000 mg | ORAL_CAPSULE | Freq: Every day | ORAL | 1 refills | Status: DC

## 2021-07-16 MED ORDER — PREDNISONE 10 MG PO TABS: ORAL_TABLET | ORAL | 0 refills | Status: DC

## 2021-07-16 NOTE — Assessment & Plan Note (Signed)
Chronic, exacerbated.  No red flags on exam.  He had an episode of this back pain about 6 months ago that resolved and then it started again 2 weeks ago.  He has been going to a chiropractor which has not been helping.  The chiropractor did order an MRI, however was not covered at the location that the chiropractor could order it at.  Will order MRI of lumbar spine.  Toradol 30 mg IM given in office today.  Start prednisone taper and gabapentin 300 mg at bedtime.  We will also give Flexeril 5 mg 3 times daily as needed.  Discussed that this can make him sleepy.  Follow-up with new provider at [facility] visit next month or sooner with concerns. ?

## 2021-07-16 NOTE — Progress Notes (Signed)
? ?Acute Office Visit ? ?Subjective:  ? ? Patient ID: Terrence Hoover, male    DOB: 1980/05/25, 41 y.o.   MRN: [id number] ? ?Chief Complaint  ?Patient presents with  ? Back Pain  ?  Pt c/o lower rt side pain that radiates down rt leg x2-3 weeks. Pain level 9-10.   ? ? ?HPI ?Patient is in today for back pain. Last September he was having back pain from possibly sleeping wrong. He went to chiropractor and it resolved after a few days. He had x-rays done and was told they were normal. He started having pain again 2 weeks ago. Endorses frequent back spasms. He can only lay on his right side. He can't stand up for 3 minutes at a time.  ? ?BACK PAIN ? ?Duration: weeks ?Mechanism of injury: no trauma ?Location: Right and low back ?Onset: sudden ?Severity: 10/10 ?Quality: spasms, sharp, tearing ?Frequency: constant ?Radiation: R leg below the knee ?Aggravating factors: bending, walking ?Alleviating factors: nothing ?Status: stable ?Treatments attempted: ice, heat, chiropractor, ibuprofen, excedrin  ?Relief with NSAIDs?: no ?Nighttime pain:  yes ?Paresthesias / decreased sensation:  no ?Bowel / bladder incontinence:  no ?Fevers:  no ?Dysuria / urinary frequency:  no ? ?History reviewed. No pertinent past medical history. ? ?History reviewed. No pertinent surgical history. ? ?Family History  ?Problem Relation Age of Onset  ? Hypertension Mother   ? Hypertension Maternal Grandmother   ? COPD Maternal Grandfather   ? Cancer Maternal Grandfather   ? Hypertension Maternal Grandfather   ? Stroke Maternal Grandfather   ? Heart attack Maternal Grandfather   ? ? ?Social History  ? ?Socioeconomic History  ? Marital status: Married  ?  Spouse name: Not on file  ? Number of children: Not on file  ? Years of education: Not on file  ? Highest education level: Not on file  ?Occupational History  ? Not on file  ?Tobacco Use  ? Smoking status: Never  ? Smokeless tobacco: Never  ?Vaping Use  ? Vaping Use: Never used  ?Substance and Sexual  Activity  ? Alcohol use: No  ? Drug use: Never  ? Sexual activity: Yes  ?  Partners: Female  ?  Birth control/protection: Condom  ?Other Topics Concern  ? Not on file  ?Social History Narrative  ? Not on file  ? ?Social Determinants of Health  ? ?Financial Resource Strain: Not on file  ?Food Insecurity: Not on file  ?Transportation Needs: Not on file  ?Physical Activity: Not on file  ?Stress: Not on file  ?Social Connections: Not on file  ?Intimate Partner Violence: Not on file  ? ? ?Outpatient Medications Prior to Visit  ?Medication Sig Dispense Refill  ? MELATONIN PO Take 15 mg by mouth at bedtime as needed.    ? testosterone cypionate (DEPOTESTOSTERONE CYPIONATE) 200 MG/ML injection Inject 1 mL (200 mg total) into the muscle every 14 (fourteen) days. 10 mL 1  ? Vitamin D, Ergocalciferol, (DRISDOL) 1.25 MG (50000 UNIT) CAPS capsule Take 1 capsule (50,000 Units total) by mouth every 7 (seven) days. 5 capsule 6  ? predniSONE (DELTASONE) 5 MG tablet Take 6 pills for first day, 5 pills second day, 4 pills third day, 3 pills fourth day, 2 pills the fifth day, and 1 pill sixth day. (Patient not taking: Reported on 02/06/2020) 21 tablet 0  ? ?Facility-Administered Medications Prior to Visit  ?Medication Dose Route Frequency Provider Last Rate Last Admin  ? testosterone cypionate (DEPOTESTOSTERONE CYPIONATE) injection 200  mg  200 mg Intramuscular Q14 Days Libby Maw, MD   200 mg at 02/20/20 2542  ? ? ?Allergies  ?Allergen Reactions  ? Morphine Anaphylaxis and Shortness Of Breath  ?  "stops breathing" ?"stops breathing" ?  ? Morphine And Related Anaphylaxis  ? Venomil Honey Bee Venom  [Honey Bee Venom] Anaphylaxis  ? ? ?Review of Systems ?See pertinent positives and negatives per HPI. ?   ?Objective:  ?  ?Physical Exam ?Vitals and nursing note reviewed.  ?Constitutional:   ?   Appearance: Normal appearance. He is obese.  ?HENT:  ?   Head: Normocephalic.  ?Eyes:  ?   Conjunctiva/sclera: Conjunctivae normal.   ?Cardiovascular:  ?   Rate and Rhythm: Normal rate.  ?Pulmonary:  ?   Effort: Pulmonary effort is normal.  ?Musculoskeletal:     ?   General: No tenderness. Normal range of motion.  ?   Cervical back: Normal range of motion.  ?   Comments: Positive straight leg raise with right leg, negative with left leg.   ?Skin: ?   General: Skin is warm.  ?Neurological:  ?   General: No focal deficit present.  ?   Mental Status: He is alert and oriented to person, place, and time.  ?Psychiatric:     ?   Mood and Affect: Mood normal.     ?   Behavior: Behavior normal.     ?   Thought Content: Thought content normal.     ?   Judgment: Judgment normal.  ? ? ?BP (!) 148/120 (BP Location: Left Arm, Patient Position: Sitting, Cuff Size: Large)   Pulse (!) 112   Temp (!) 97.1 ?F (36.2 ?C) (Temporal)   Wt 298 lb (135.2 kg)   SpO2 95%   BMI 41.56 kg/m?  ?Wt Readings from Last 3 Encounters:  ?07/16/21 298 lb (135.2 kg)  ?02/10/20 (!) 323 lb (146.5 kg)  ?02/06/20 (!) 323 lb 6.4 oz (146.7 kg)  ? ? ?Health Maintenance Due  ?Topic Date Due  ? HIV Screening  Never done  ? Hepatitis C Screening  Never done  ? COVID-19 Vaccine (3 - Moderna risk series) 06/25/2019  ? INFLUENZA VACCINE  12/14/2020  ? ? ?There are no preventive care reminders to display for this patient. ? ? ?Lab Results  ?Component Value Date  ? TSH 3.34 02/06/2020  ? ?Lab Results  ?Component Value Date  ? WBC 8.3 02/06/2020  ? HGB 16.1 02/06/2020  ? HCT 47.3 02/06/2020  ? MCV 90.7 02/06/2020  ? PLT 266.0 02/06/2020  ? ?Lab Results  ?Component Value Date  ? NA 138 02/06/2020  ? K 3.9 02/06/2020  ? CO2 28 02/06/2020  ? GLUCOSE 128 (H) 02/06/2020  ? BUN 8 02/06/2020  ? CREATININE 1.08 02/06/2020  ? BILITOT 0.6 02/06/2020  ? ALKPHOS 60 02/06/2020  ? AST 22 02/06/2020  ? ALT 37 02/06/2020  ? PROT 6.6 02/06/2020  ? ALBUMIN 4.3 02/06/2020  ? CALCIUM 9.2 02/06/2020  ? ANIONGAP 11 04/29/2016  ? GFR 75.55 02/06/2020  ? ?Lab Results  ?Component Value Date  ? CHOL 185 02/06/2020   ? ?Lab Results  ?Component Value Date  ? HDL 38.00 (L) 02/06/2020  ? ?Lab Results  ?Component Value Date  ? LDLCALC 129 (H) 02/06/2020  ? ?Lab Results  ?Component Value Date  ? TRIG 92.0 02/06/2020  ? ?Lab Results  ?Component Value Date  ? CHOLHDL 5 02/06/2020  ? ?No results found for: HGBA1C ? ?   ?  Assessment & Plan:  ? ?Problem List Items Addressed This Visit   ? ?  ? Nervous and Auditory  ? Chronic right-sided low back pain with right-sided sciatica - Primary  ?  Chronic, exacerbated.  No red flags on exam.  He had an episode of this back pain about 6 months ago that resolved and then it started again 2 weeks ago.  He has been going to a chiropractor which has not been helping.  The chiropractor did order an MRI, however was not covered at the location that the chiropractor could order it at.  Will order MRI of lumbar spine.  Toradol 30 mg IM given in office today.  Start prednisone taper and gabapentin 300 mg at bedtime.  We will also give Flexeril 5 mg 3 times daily as needed.  Discussed that this can make him sleepy.  Follow-up with new provider at [facility] visit next month or sooner with concerns. ?  ?  ? Relevant Medications  ? cyclobenzaprine (FLEXERIL) 5 MG tablet  ? gabapentin (NEURONTIN) 300 MG capsule  ? predniSONE (DELTASONE) 10 MG tablet  ? Other Relevant Orders  ? MR Lumbar Spine Wo Contrast  ? ? ? ?Meds ordered this encounter  ?Medications  ? cyclobenzaprine (FLEXERIL) 5 MG tablet  ?  Sig: Take 1 tablet (5 mg total) by mouth 3 (three) times daily as needed for muscle spasms.  ?  Dispense:  30 tablet  ?  Refill:  1  ? gabapentin (NEURONTIN) 300 MG capsule  ?  Sig: Take 1 capsule (300 mg total) by mouth at bedtime.  ?  Dispense:  30 capsule  ?  Refill:  1  ? predniSONE (DELTASONE) 10 MG tablet  ?  Sig: Take 6 tablets today, then 5 tablets tomorrow, then decrease by 1 tablet every day until gone  ?  Dispense:  21 tablet  ?  Refill:  0  ? ketorolac (TORADOL) injection 30 mg  ? ? ? ?Charyl Dancer, NP ? ?

## 2021-07-16 NOTE — Patient Instructions (Signed)
It was great to see you! ? ?I have placed an order for your MRI, they should call to schedule. ? ?We are giving you 30mg  toradol in the office. Start gabapentin 1 capsule at bedtime, flexeril every 8 hours as needed for muscle spasms (this may make you sleepy), and prednisone taper in the mornings with food.  ? ?Let's follow-up in 4 weeks, sooner if you have concerns. ? ?If a referral was placed today, you will be contacted for an appointment. Please note that routine referrals can sometimes take up to 3-4 weeks to process. Please call our office if you haven't heard anything after this time frame. ? ?Take care, ? ?Vance Peper, NP ? ?

## 2021-08-02 ENCOUNTER — Telehealth: Payer: Self-pay | Admitting: Nurse Practitioner

## 2021-08-02 MED ORDER — PREDNISONE 10 MG PO TABS: ORAL_TABLET | ORAL | 0 refills | Status: DC

## 2021-08-02 NOTE — Telephone Encounter (Signed)
Pt leg swelling again, he recently saw Lauren for this and she prescribed prednisone. Pt sees Dr Gena Fray for [facility] on the 5th... and would a refill for this until he sees him.  ?

## 2021-08-02 NOTE — Telephone Encounter (Signed)
requested prescription refill was approved and sent to preferred pharmacy. ?

## 2021-08-18 ENCOUNTER — Encounter: Payer: Managed Care, Other (non HMO) | Admitting: Family Medicine

## 2021-09-28 ENCOUNTER — Encounter: Payer: Managed Care, Other (non HMO) | Admitting: Family Medicine

## 2021-09-28 ENCOUNTER — Telehealth: Payer: Self-pay | Admitting: Family Medicine

## 2021-09-28 NOTE — Telephone Encounter (Signed)
Patient/Caregiver was notified of No Show/Late Cancellation Policy & possible $97 charge. ?Visit was cancelled with reason "No Show/Cancel within 24 hours" for tracking & charging. ? ? ?Date of APPT: 09/28/21 ?Reason given for no show/late cancellation: no reason no call ?No Show Letter printed & put in outgoing mail (Yes/No): yes ? ?~~~Route message to admin supervisor and clinical team/CMA~~~ ? ?  ?

## 2021-10-20 NOTE — Telephone Encounter (Signed)
2nd no show for TOC to Dr. Gena Fray 09/28/21 & 05/12/21. Pt has been blocked from RS TOC/New Pt with Dr. Gena Fray.

## 2021-12-22 ENCOUNTER — Encounter (INDEPENDENT_AMBULATORY_CARE_PROVIDER_SITE_OTHER): Payer: Self-pay

## 2022-06-23 ENCOUNTER — Telehealth: Payer: Self-pay | Admitting: Family Medicine

## 2022-06-23 NOTE — Telephone Encounter (Signed)
Pt would like do TOC from dr Ethelene Hal to dr Gena Fray . Est with a different dr.

## 2022-06-23 NOTE — Telephone Encounter (Signed)
Please advise FYI  

## 2022-08-03 ENCOUNTER — Encounter: Payer: Self-pay | Admitting: Family Medicine

## 2022-08-03 ENCOUNTER — Ambulatory Visit: Payer: BC Managed Care – PPO | Admitting: Family Medicine

## 2022-08-03 VITALS — BP 136/94 | HR 49 | Temp 97.6°F | Ht 71.0 in | Wt 294.0 lb

## 2022-08-03 DIAGNOSIS — H02402 Unspecified ptosis of left eyelid: Secondary | ICD-10-CM

## 2022-08-03 DIAGNOSIS — G7000 Myasthenia gravis without (acute) exacerbation: Secondary | ICD-10-CM | POA: Insufficient documentation

## 2022-08-03 DIAGNOSIS — I10 Essential (primary) hypertension: Secondary | ICD-10-CM | POA: Diagnosis not present

## 2022-08-03 DIAGNOSIS — E119 Type 2 diabetes mellitus without complications: Secondary | ICD-10-CM | POA: Insufficient documentation

## 2022-08-03 DIAGNOSIS — L728 Other follicular cysts of the skin and subcutaneous tissue: Secondary | ICD-10-CM

## 2022-08-03 DIAGNOSIS — Z6841 Body Mass Index (BMI) 40.0 and over, adult: Secondary | ICD-10-CM

## 2022-08-03 DIAGNOSIS — Z131 Encounter for screening for diabetes mellitus: Secondary | ICD-10-CM

## 2022-08-03 DIAGNOSIS — R7303 Prediabetes: Secondary | ICD-10-CM | POA: Insufficient documentation

## 2022-08-03 LAB — COMPREHENSIVE METABOLIC PANEL
ALT: 42 U/L (ref 0–53)
AST: 24 U/L (ref 0–37)
Albumin: 4.3 g/dL (ref 3.5–5.2)
Alkaline Phosphatase: 64 U/L (ref 39–117)
BUN: 11 mg/dL (ref 6–23)
CO2: 24 mEq/L (ref 19–32)
Calcium: 9.7 mg/dL (ref 8.4–10.5)
Chloride: 104 mEq/L (ref 96–112)
Creatinine, Ser: 0.89 mg/dL (ref 0.40–1.50)
GFR: 105.37 mL/min (ref >60.00)
Glucose, Bld: 149 mg/dL — ABNORMAL HIGH (ref 70–99)
Potassium: 4 mEq/L (ref 3.5–5.1)
Sodium: 137 mEq/L (ref 135–145)
Total Bilirubin: 0.5 mg/dL (ref 0.2–1.2)
Total Protein: 6.9 g/dL (ref 6.0–8.3)

## 2022-08-03 LAB — CBC
HCT: 47.4 % (ref 39.0–52.0)
Hemoglobin: 16.4 g/dL (ref 13.0–17.0)
MCHC: 34.5 g/dL (ref 30.0–36.0)
MCV: 89.4 fl (ref 78.0–100.0)
Platelets: 270 10*3/uL (ref 150.0–400.0)
RBC: 5.3 Mil/uL (ref 4.22–5.81)
RDW: 13.2 % (ref 11.5–15.5)
WBC: 7.6 10*3/uL (ref 4.0–10.5)

## 2022-08-03 LAB — HEMOGLOBIN A1C: Hgb A1c MFr Bld: 6.5 % (ref 4.6–6.5)

## 2022-08-03 LAB — TSH: TSH: 2 u[IU]/mL (ref 0.35–5.50)

## 2022-08-03 LAB — T4, FREE: Free T4: 0.76 ng/dL (ref 0.60–1.60)

## 2022-08-03 MED ORDER — AMLODIPINE BESYLATE 5 MG PO TABS: 5.0000 mg | ORAL_TABLET | Freq: Every day | ORAL | 3 refills | Status: DC

## 2022-08-03 NOTE — Assessment & Plan Note (Signed)
Maximum weight: 325 lbs (08/2019) Current weight: 294 lbs Total weight loss: 31 lbs (9.5%)  We discussed diet and exercise approaches to weight loss. I did discuss the role of medication management of weight loss. I will check some labs to screen for complications of obesity. I recommend he check with his insurance program regarding coverage for treatment of obesity. We will readdress at a future visit.

## 2022-08-03 NOTE — Assessment & Plan Note (Signed)
Terrence Hoover has had a consistently elevated blood pressure c/w essential hypertension. I recommend we do so screening labs and start him on amlodipine. I wills ee him back in 1 month to reassess his blood pressure.

## 2022-08-03 NOTE — Progress Notes (Signed)
[facility] [address] [place] [address] Dept: [phone] Dept Fax: [phone]  Transfer of Care Office Visit  Subjective:    Patient ID: AZLAAN ISIDORE, male    DOB: 05-09-1981, 42 y.o..   MRN: [id number]  Chief Complaint  Patient presents with   Establish Care    [facility]- establish care.  C/o having LT eye double vision and eye closing on its own.  Discuss weight loss, and mole removed form below eye.     History of Present Illness:  Patient is in today to establish care. Mr. Altamura was born in [place] and grew up in [place], [place]. He moved to [place] in 2004 with his wife. Mr. Waymire attended [employer] majoring in criminal justice. He attended [employer] in Research scientist (medical). He then attended [employer] in EMS. 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 He has been married Psychologist, occupational) for 20 years. They have three daughters (18, 49, 38). He rarely smokes a cigar. He denies use of alcohol or drugs.  Mr. Pariseau has a history of severe sleep apnea. He has been unable to tolerate use of a CPAP. He manages this by trying to adjust his sleep position.   Mr. Pabst has a history of androgen deficiency. he was previously treated with testosterone injections, but is no longer receiving these due to cost.  Mr. Eckrich has a history of elevated blood pressures. He has never been on treatment for this. he has multiple family members with hypertension.  Mr. Gilreath has a history of obesity. He has been working at weight loss. He notes he has been working at weight loss and has had some gradual decrease in his weight. He asks about medication management.  Mr. Nack notes that starting Friday, he began having an issues with his left eye closing and then him not being able to reopen it for some time. This is intermittent. He had thought he  might have an eye infection, so he has been using an antibiotic drop he got from another family member. This has not helped at this point.   Mr. Mangas notes he has some raised lesions on the face near his eyes. He finds these are bothersome, as he can see these in his visual field.  Past Medical History: Patient Active Problem List   Diagnosis Date Noted   Chronic right-sided low back pain with right-sided sciatica 07/16/2021   Androgen deficiency 02/07/2020   Vitamin D deficiency 02/07/2020   Circadian rhythm sleep disorder, shift work type 01/16/2019   Morbid obesity with body mass index (BMI) of 45.0 to 49.9 in adult ([place]) 01/16/2019   Nocturia more than twice per night 01/16/2019   Episodic cluster headache, not intractable 01/16/2019   Severe obstructive sleep apnea-hypopnea syndrome 01/16/2019   Insomnia 11/27/2018   Essential hypertension 12/01/2015   Past Surgical History:  Procedure Laterality Date   SHOULDER ARTHROSCOPY W/ ROTATOR CUFF REPAIR Right    Family History  Problem Relation Age of Onset   Hypertension Mother    Alcohol abuse Father    Hypertension Maternal Grandmother    Heart disease Maternal Grandfather    COPD Maternal Grandfather    Cancer Maternal Grandfather        Skin, lung   Hypertension Maternal Grandfather    Stroke Maternal Grandfather    Heart attack Maternal Grandfather    Outpatient Medications Prior to  Visit  Medication Sig Dispense Refill   aspirin EC 325 MG tablet Take 325 mg by mouth daily.     cyclobenzaprine (FLEXERIL) 5 MG tablet Take 1 tablet (5 mg total) by mouth 3 (three) times daily as needed for muscle spasms. 30 tablet 1   gabapentin (NEURONTIN) 300 MG capsule Take 1 capsule (300 mg total) by mouth at bedtime. 30 capsule 1   MELATONIN PO Take 15 mg by mouth at bedtime as needed.     predniSONE (DELTASONE) 10 MG tablet Take 6 tablets today, then 5 tablets tomorrow, then decrease by 1 tablet every day until gone 21 tablet 0    testosterone cypionate (DEPOTESTOSTERONE CYPIONATE) 200 MG/ML injection Inject 1 mL (200 mg total) into the muscle every 14 (fourteen) days. 10 mL 1   Vitamin D, Ergocalciferol, (DRISDOL) 1.25 MG (50000 UNIT) CAPS capsule Take 1 capsule (50,000 Units total) by mouth every 7 (seven) days. 5 capsule 6   testosterone cypionate (DEPOTESTOSTERONE CYPIONATE) injection 200 mg      No facility-administered medications prior to visit.   Allergies  Allergen Reactions   Morphine Anaphylaxis and Shortness Of Breath    "stops breathing" "stops breathing"    Morphine And Related Anaphylaxis   Venomil Honey Bee Venom  [Honey Bee Venom] Anaphylaxis      Objective:   Today's Vitals   08/03/22 1310  BP: (!) 136/94  Pulse: (!) 49  Temp: 97.6 F (36.4 C)  TempSrc: Temporal  SpO2: 97%  Weight: 294 lb (133.4 kg)  Height: 5\' 11"  (1.803 m)   Body mass index is 41 kg/m.   General: Well developed, well nourished. Obese. No acute distress. HEENT: Normocephalic, non-traumatic. PERRL, EOMI. Conjunctiva clear. Observe that   intermittently the left eyelid closes and the patient is unable to reopen this for 30-60 seconds.   There is a small pedunculated cyst on the left check, below the eye and a small domed cyst on   the right. Neuro: CN II-XII intact, except for the ptosis issue of the left eye as described above. Psych: Alert and oriented. Normal mood and affect.  Health Maintenance Due  Topic Date Due   HIV Screening  Never done   Hepatitis C Screening  Never done     Assessment & Plan:   Problem List Items Addressed This Visit       Cardiovascular and Mediastinum   Essential hypertension - Primary    Mr. Peduzzi has had a consistently elevated blood pressure c/w essential hypertension. I recommend we do so screening labs and start him on amlodipine. I wills ee him back in 1 month to reassess his blood pressure.      Relevant Medications   aspirin EC 325 MG tablet   amLODipine  (NORVASC) 5 MG tablet   Other Relevant Orders   CBC (Completed)   Comprehensive metabolic panel (Completed)   TSH (Completed)   T4, free (Completed)     Other   Morbid obesity with body mass index (BMI) of 45.0 to 49.9 in adult ([facility])    Maximum weight: 325 lbs (08/2019) Current weight: 294 lbs Total weight loss: 31 lbs (9.5%)  We discussed diet and exercise approaches to weight loss. I did discuss the role of medication management of weight loss. I will check some labs to screen for complications of obesity. I recommend he check with his insurance program regarding coverage for treatment of obesity. We will readdress at a future visit.      Ptosis  of left eyelid    The intermittent ptosis could represent ocular myasthenia gravis or some other nerve palsy. I recommend he try placing an ice pack on the eye and see if this provides some temporary resolution. This could help to build the case for ocular MG. I will check AChR-Ab titers. I will go ahead and refer him to neurology for an assessment.      Relevant Orders   Acetylcholine receptor, blocking Abs   Ambulatory referral to Neurology   Other Visit Diagnoses     Other follicular cysts of the skin and subcutaneous tissue- face       I recommend dermatology referral for the facial cysts to consider removal.   Relevant Orders   Ambulatory referral to Dermatology   Screening for diabetes mellitus (DM)       Relevant Orders   Hemoglobin A1c (Completed)      Return in about 4 weeks (around 08/31/2022) for Reassessment.   Haydee Salter, MD

## 2022-08-03 NOTE — Assessment & Plan Note (Signed)
The intermittent ptosis could represent ocular myasthenia gravis or some other nerve palsy. I recommend he try placing an ice pack on the eye and see if this provides some temporary resolution. This could help to build the case for ocular MG. I will check AChR-Ab titers. I will go ahead and refer him to neurology for an assessment.

## 2022-08-12 LAB — ACETYLCHOLINE RECEPTOR, BLOCKING: ACHR Blocking Abs: 21 % Inhibition — ABNORMAL HIGH (ref <15)

## 2022-08-15 ENCOUNTER — Telehealth: Payer: Self-pay | Admitting: Family Medicine

## 2022-08-15 NOTE — Telephone Encounter (Signed)
Pt wife called [phone] the neurologist will not see the patient because the pt has a previous balance with them . Can you call the pt or his wife back at [phone] . And can you referral the pt to a different neurologist

## 2022-08-15 NOTE — Telephone Encounter (Signed)
Spoke to [place], pt's wife, can we please and thank you send to another Neurologist.   Thanks. Dm/cma

## 2022-08-16 ENCOUNTER — Encounter: Payer: Self-pay | Admitting: Neurology

## 2022-08-29 ENCOUNTER — Encounter: Payer: Self-pay | Admitting: *Deleted

## 2022-09-01 ENCOUNTER — Ambulatory Visit: Payer: BC Managed Care – PPO | Admitting: Family Medicine

## 2022-09-01 ENCOUNTER — Encounter: Payer: Self-pay | Admitting: Family Medicine

## 2022-09-01 VITALS — BP 130/78 | HR 83 | Temp 98.3°F | Ht 71.0 in | Wt 293.6 lb

## 2022-09-01 DIAGNOSIS — I10 Essential (primary) hypertension: Secondary | ICD-10-CM

## 2022-09-01 DIAGNOSIS — R7303 Prediabetes: Secondary | ICD-10-CM | POA: Diagnosis not present

## 2022-09-01 DIAGNOSIS — G7000 Myasthenia gravis without (acute) exacerbation: Secondary | ICD-10-CM | POA: Diagnosis not present

## 2022-09-01 NOTE — Assessment & Plan Note (Signed)
Recommend we repeat glucose and A1c testing.

## 2022-09-01 NOTE — Patient Instructions (Signed)
Myasthenia Gravis Myasthenia gravis (MG) is a long-term, or chronic, condition that causes weakness in the muscles you can control (voluntary muscles). MG can affect any voluntary muscle. The muscles most often affected are the ones that control: Eye movement. Facial movements. Swallowing. MG is a disease in which the body's disease-fighting system (immune system) attacks its own healthy tissues. This type of disease is known as an autoimmune disease. What are the causes? This condition is caused by a type of protein (antibody) that interferes with how the nerves and muscles communicate. When you have MG, your immune system makes antibodies that block a chemical called acetylcholine that your body needs to send nerve signals to your muscles. This causes muscle weakness. What increases the risk? The following factors may make you more likely to develop this condition: Having an enlarged thymus gland. The thymus gland is located under the breastbone. It makes certain cells for the immune system. Having a family history of MG. What are the signs or symptoms? Symptoms of MG may include: Drooping eyelids. Double vision. Muscle weakness that gets worse with activity and gets better after rest. Trouble chewing and swallowing. Trouble making facial expressions. Slurred speech. Difficulty walking. Weakness of the arms, hands, and legs. Sudden, severe difficulty breathing (myasthenic crisis) may develop after having: An infection. A fever. A bad reaction to a medicine. Myasthenic crisis requires emergency breathing support. Sometimes symptoms of MG go away for a while (remission) and then come back later. How is this diagnosed? This condition may be diagnosed based on: Your symptoms and medical history. A physical exam. Blood tests. Tests of your muscle strength and function. Imaging tests, such as a CT scan of the chest. How is this treated? The goal of treatment is to improve muscle  strength. Treatment may include: Taking medicine. Doing physical therapy exercises to gain strength. Having surgery to remove the thymus gland (thymectomy). This may result in a long remission for some people. Having a procedure to remove the acetylcholine antibodies (plasmapheresis). Getting emergency breathing support, if you experience myasthenic crisis. If you experience remission, you may be able to stop treatment and then resume treatment when your symptoms return. Follow these instructions at home:  Take over-the-counter and prescription medicines only as told by your health care provider. Get plenty of rest and sleep. Take frequent breaks to rest your eyes, especially when in bright light or working on a computer. Maintain a healthy diet and a healthy weight. Work with your health care provider or a dietitian if you need help. Do exercises as told by your health care provider or physical therapist. Do not use any products that contain nicotine or tobacco. These products include cigarettes, chewing tobacco, and vaping devices, such as e-cigarettes. If you need help quitting, ask your health care provider. Prevent infections by: Washing your hands often with soap and water for at least 20 seconds. If soap and water are not available, use hand sanitizer. Avoiding contact with other people who are sick. Avoiding touching your eyes, nose, and mouth. Using a disinfectant to regularly clean surfaces in your home that are touched often. Keep all follow-up visits. This is important. Where to find more information National Institute of Neurological Disorders and Stroke: www.ninds.nih.gov Contact a health care provider if: Your symptoms change or get worse, especially after having a fever or infection. Get help right away if: You have trouble breathing. You develop new swallowing problems. You develop persistent double vision. These symptoms may be an emergency. Get help   right away. Call  911. Do not wait to see if the symptoms will go away. Do not drive yourself to the hospital. Summary Myasthenia gravis (MG) is a long-term, or chronic, condition that causes weakness in the muscles you can control (voluntary muscles). A symptom of MG is muscle weakness that gets worse with activity and gets better after rest. Sudden, severe difficulty breathing (myasthenic crisis) may develop after having an infection, a fever, or a bad reaction to a medicine. The goal of treatment is to improve muscle strength. Treatment may include medicines, lifestyle changes, physical therapy, surgery, plasmapheresis, or emergency breathing support. This information is not intended to replace advice given to you by your health care provider. Make sure you discuss any questions you have with your health care provider. Document Revised: 02/19/2021 Document Reviewed: 02/19/2021 Elsevier Patient Education  2023 Elsevier Inc.  

## 2022-09-01 NOTE — Progress Notes (Signed)
[facility] [address] [place] [address] Dept: [phone] Dept Fax: [phone]  Chronic Care Office Visit  Subjective:    Patient ID: Terrence Hoover, male    DOB: 1980-06-03, 42 y.o..   MRN: [id number]  Chief Complaint  Patient presents with   Medical Management of Chronic Issues    F/u , still having eye issues and has an appointment with neurology on 09/14/22.   History of Present Illness:  Patient is in today for reassessment of chronic medical issues.  Mr. Fildes has a history of hypertension. At his first visit with me, we started hiom on amlodipine 5 mg daily. He is taking this without issues.   Mr. Hoshino recent blood tests showed some mild hyperglycemia and an A1c of 6.5%.   At his first visit, Mr. Huaracha complained of an issue with his left eye closing and then him not being able to reopen it for some time. I made a presumptive diagnosis of ocular myasthenia gravis. He now notes this is occurring with both eyes. He finds this is interfering with his work He did see an eye doctor, who did not find other eye disease, but had recommended he consider being seen at [facility]. He has an appointment with a neurologist on May1st in [place].  Past Medical History: Patient Active Problem List   Diagnosis Date Noted   Ocular myasthenia gravis 08/03/2022   Prediabetes 08/03/2022   Chronic right-sided low back pain with right-sided sciatica 07/16/2021   Androgen deficiency 02/07/2020   Vitamin D deficiency 02/07/2020   Circadian rhythm sleep disorder, shift work type 01/16/2019   Morbid obesity with body mass index (BMI) of 45.0 to 49.9 in adult 01/16/2019   Nocturia more than twice per night 01/16/2019   Episodic cluster headache, not intractable 01/16/2019   Severe obstructive sleep apnea-hypopnea syndrome 01/16/2019   Insomnia 11/27/2018   Essential hypertension 12/01/2015   Past Surgical History:   Procedure Laterality Date   SHOULDER ARTHROSCOPY W/ ROTATOR CUFF REPAIR Right    Family History  Problem Relation Age of Onset   Hypertension Mother    Alcohol abuse Father    Hypertension Maternal Grandmother    Heart disease Maternal Grandfather    COPD Maternal Grandfather    Cancer Maternal Grandfather        Skin, lung   Hypertension Maternal Grandfather    Stroke Maternal Grandfather    Heart attack Maternal Grandfather    Outpatient Medications Prior to Visit  Medication Sig Dispense Refill   amLODipine (NORVASC) 5 MG tablet Take 1 tablet (5 mg total) by mouth daily. 30 tablet 3   aspirin EC 325 MG tablet Take 325 mg by mouth daily.     No facility-administered medications prior to visit.   Allergies  Allergen Reactions   Morphine Anaphylaxis and Shortness Of Breath    "stops breathing" "stops breathing"    Morphine And Related Anaphylaxis   Venomil Honey Bee Venom  [Honey Bee Venom] Anaphylaxis   Objective:   Today's Vitals   09/01/22 0928  BP: 130/78  Pulse: 83  Temp: 98.3 F (36.8 C)  TempSrc: Temporal  SpO2: 97%  Weight: 293 lb 9.6 oz (133.2 kg)  Height:  (1.803 m)   Body mass index is 40.95 kg/m.   General: Well developed, well nourished. No acute distress. Psych: Alert and oriented. Normal mood and affect.  Health Maintenance Due  Topic Date Due   HIV Screening  Never  done   Hepatitis C Screening  Never done   Lab Results: Lab Results  Component Value Date   HGBA1C 6.5 08/03/2022      Latest Ref Rng & Units 08/03/2022    2:16 PM 02/06/2020   10:27 AM 11/27/2018   11:48 AM  BMP  Glucose 70 - 99 mg/dL 161  096  045   BUN 6 - 23 mg/dL Creatinine 0.40 - 1.50 mg/dL 4.09  8.11  9.14   BUN/Creat Ratio 6 - 22 (calc)   NOT APPLICABLE   Sodium 135 - 145 mEq/L 137  138  137   Potassium 3.5 - 5.1 mEq/L 4.0  3.9  4.2   Chloride 96 - 112 mEq/L 104  103  104   CO2 19 - 32 mEq/L Calcium 8.4 - 10.5 mg/dL 9.7  9.2   9.3    Component Ref Range & Units 4 wk ago  ACHR Blocking Abs <15 % Inhibition 21 High    Assessment & Plan:   Problem List Items Addressed This Visit       Cardiovascular and Mediastinum   Essential hypertension - Primary    Blood pressure is in good control. Continue amlodipine 5 mg daily.        Nervous and Auditory   Ocular myasthenia gravis    ACH receptor antibodies are elevated, confirming MG. I recommend he follow through with seeing the neurologist regarding any further testing that may be needed and to discuss treatment options. He is anxious to get this addressed, particularly related to the impacts on his job. I recommend he keep his current neurology appointment, as trying to get in at [facility] right now would likely further delay him being seen.        Other   Prediabetes    Recommend we repeat glucose and A1c testing.      Relevant Orders   Glucose, random   Hemoglobin A1c    Return in about 3 months (around 12/01/2022) for Reassessment.   Loyola Mast, MD

## 2022-09-01 NOTE — Assessment & Plan Note (Signed)
Blood pressure is in good control. Continue amlodipine 5 mg daily. 

## 2022-09-01 NOTE — Assessment & Plan Note (Signed)
ACH receptor antibodies are elevated, confirming MG. I recommend he follow through with seeing the neurologist regarding any further testing that may be needed and to discuss treatment options. He is anxious to get this addressed, particularly related to the impacts on his job. I recommend he keep his current neurology appointment, as trying to get in at [facility] right now would likely further delay him being seen.

## 2022-09-06 ENCOUNTER — Other Ambulatory Visit: Payer: BC Managed Care – PPO

## 2022-09-07 ENCOUNTER — Other Ambulatory Visit: Payer: Self-pay | Admitting: Family Medicine

## 2022-09-07 ENCOUNTER — Other Ambulatory Visit (INDEPENDENT_AMBULATORY_CARE_PROVIDER_SITE_OTHER): Payer: BC Managed Care – PPO

## 2022-09-07 ENCOUNTER — Telehealth: Payer: Self-pay | Admitting: Family Medicine

## 2022-09-07 ENCOUNTER — Encounter: Payer: Self-pay | Admitting: Family Medicine

## 2022-09-07 DIAGNOSIS — E119 Type 2 diabetes mellitus without complications: Secondary | ICD-10-CM

## 2022-09-07 DIAGNOSIS — R7303 Prediabetes: Secondary | ICD-10-CM

## 2022-09-07 LAB — HEMOGLOBIN A1C: Hgb A1c MFr Bld: 7 % — ABNORMAL HIGH (ref 4.6–6.5)

## 2022-09-07 MED ORDER — RYBELSUS 3 MG PO TABS: 3.0000 mg | ORAL_TABLET | Freq: Every day | ORAL | 0 refills | Status: DC

## 2022-09-07 NOTE — Telephone Encounter (Signed)
Patient notified VIA phone and schedule an appointment for 10/05/22.  Dm/cma

## 2022-09-07 NOTE — Progress Notes (Signed)
Initial neurology clinic note  SERVICE DATE: 09/14/22  Reason for Evaluation: Consultation requested by Loyola Mast, MD for an opinion regarding ptosis, diplopia. My final recommendations will be communicated back to the requesting physician by way of shared medical record or letter to requesting physician via [place] mail.  HPI: This is Mr. Terrence Hoover, a 42 y.o. left-handed male with a medical history of DM, OSA (does not have anymore per patient), androgen deficiency (previously treated with testosterone), vit D deficiency, HTN who presents to neurology clinic with the chief complaint of ptosis. The patient is accompanied by wife and daughter.  Patient is having difficulty opening his eyelids and diplopia. It started about 1.5 months ago. The diplopia goes away by covering one eye. Both symptoms fluctuate. He thinks symptoms are worse when driving. He thinks the mornings and evenings are okay. It gets worse as the day goes on. He first thought it was a stye in his eye. It did not get better, so he saw his PCP who felt this was ocular myasthenia gravis. He also saw ophtho who also felt it was OMG. AChR abs were sent and positive (blocking only sent, elevated to 21).  Current MG symptoms: Ptosis: yes, see above Double vision: yes, see above Speech: sometimes feels like he has slurred speech. If he stops and thinks, it improves. Chewing: No problems Swallowing: No problems Breathing: No problems including orthopnea Arm strength: No weakness Leg strength: No weakness  He does not report any constitutional symptoms like fever, night sweats, anorexia or unintentional weight loss.  EtOH use: None  Restrictive diet? No  Family history of neuropathy/myopathy/NM disease? No   MEDICATIONS:  Outpatient Encounter Medications as of 09/14/2022  Medication Sig   amLODipine (NORVASC) 5 MG tablet Take 1 tablet (5 mg total) by mouth daily.   aspirin EC 325 MG tablet Take 325 mg by mouth as  needed.   metFORMIN (GLUCOPHAGE) 500 MG tablet Take 1 tablet (500 mg total) by mouth daily with breakfast.   predniSONE (DELTASONE) 20 MG tablet Take 1 tablet (20 mg total) by mouth daily for 7 days, THEN 2 tablets (40 mg total) daily.   pyridostigmine (MESTINON) 60 MG tablet Take 1 tablet (60 mg total) by mouth 3 (three) times daily.   No facility-administered encounter medications on file as of 09/14/2022.    PAST MEDICAL HISTORY: Past Medical History:  Diagnosis Date   Androgen deficiency    Diabetes mellitus without complication (HCC)    Hypertension    OSA (obstructive sleep apnea)    Vitamin D deficiency     PAST SURGICAL HISTORY: Past Surgical History:  Procedure Laterality Date   SHOULDER ARTHROSCOPY W/ ROTATOR CUFF REPAIR Right     ALLERGIES: Allergies  Allergen Reactions   Morphine Anaphylaxis and Shortness Of Breath    "stops breathing" "stops breathing"    Morphine And Related Anaphylaxis   Venomil Honey Bee Venom  [Honey Bee Venom] Anaphylaxis    FAMILY HISTORY: Family History  Problem Relation Age of Onset   Hypertension Mother    Alcohol abuse Father    Hypertension Maternal Grandmother    Heart disease Maternal Grandfather    COPD Maternal Grandfather    Cancer Maternal Grandfather        Skin, lung   Hypertension Maternal Grandfather    Stroke Maternal Grandfather    Heart attack Maternal Grandfather     SOCIAL HISTORY: Social History   Tobacco Use   Smoking status: Never  Smokeless tobacco: Never  Vaping Use   Vaping Use: Never used  Substance Use Topics   Alcohol use: No   Drug use: Never   Social History   Social History Narrative   Are you right handed or left handed? Left   Are you currently employed ?    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 no   Who lives with you? Wife and 3 kids   What type of home do you live in: 1 story or 2 story? One story    Caffeine occasionally      OBJECTIVE: PHYSICAL EXAM: BP 130/82   Pulse 82   Ht 5\' 11"  (1.803 m)   Wt 295 lb (133.8 kg)   SpO2 96%   BMI 41.14 kg/m   General: General appearance: Awake and alert. No distress. Cooperative with exam.  Skin: No obvious rash or jaundice. HEENT: Atraumatic. Anicteric. Lungs: Non-labored breathing on room air  Extremities: No edema. No obvious deformity.  Musculoskeletal: No obvious joint swelling. Psych: Affect appropriate.  Neurological: Mental Status: Alert. Speech fluent. No pseudobulbar affect Cranial Nerves: CNII: No RAPD. Visual fields grossly intact. CNIII, IV, VI: PERRL. Not able to fully abduct his left eye. Not able to turn eyes to right (right eye does not abduct, left eye minimally adducts). Diplopia worse with right gaze and upgaze. CN V: Facial sensation intact bilaterally to fine touch.  CN VII: Facial muscles weak (orbicularis oculi and oris). Severe bilateral ptosis. CN VIII: Hearing grossly intact bilaterally. CN IX: No hypophonia. CN X: Palate elevates symmetrically. CN XI: Full strength shoulder shrug bilaterally. CN XII: Tongue protrusion full and midline. No atrophy or fasciculations. No significant dysarthria Motor: Tone is normal. Strength 5/5 in bilateral upper extremities. No fatigability in proximal muscles appreciated. Reflexes:  Right Left   Bicep 1+ 1+   Tricep 1+ 1+   BrRad 1+ 1+   Knee 2+ 2+   Ankle 1+ 1+    Sensation Intact to light touch in all extremities Coordination: Intact finger-to- nose-finger bilaterally. Romberg negative. Gait: Able to rise from chair with arms crossed unassisted. Normal, narrow-based gait. Able to walk on toes and heels.  Lab and Test Review: Internal labs: 08/03/22: TSH: 2.00 AChR blocking antibodies: elevated to 21 CMP significant for elevated glucose CBC wnl  HbA1c (09/07/22): 7.0  Vit D (02/06/2020): 10.13  Imaging: EMG (10/21/19 by Dr. 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  The EMG evaluation of the right upper extremity is unremarkable, no evidence of an overlying cervical radiculopathy is seen.  Clinical evaluation this patient suggests a possible right lateral epicondylitis.  ASSESSMENT: Terrence Hoover is a 42 y.o. male who presents for evaluation of ptosis and diplopia. He has a relevant medical history of DM, OSA (does not have anymore per patient), androgen deficiency (previously treated with testosterone), vit D deficiency, HTN. His neurological examination is pertinent for severe ptosis bilaterally, facial weakness, and severe extraocular muscle weakness. Available diagnostic data is significant for AChR blocking antibodies positive (21), HbA1c of 7. Patient's symptoms are consistent with myasthenia gravis, at least ocular, perhaps generalized given intermittent slurred speech.  PLAN: -Blood work: AChR abs (binding, blocking, modulating) -CT chest w/wo contrast -Start mestinon 60 mg TID -Start prednisone 20 mg daily for 1 week, then increase to 40 mg daily -May need steroid sparing agent if not able to  quickly taper down steroids given DM and HTN -Will discuss thymectomy at next appointment  -Return to clinic in 1 months  The impression above as well as the plan as outlined below were extensively discussed with the patient (in the company of wife) who voiced understanding. All questions were answered to their satisfaction.  When available, results of the above investigations and possible further recommendations will be communicated to the patient via telephone/MyChart. Patient to call office if not contacted after expected testing turnaround time.   Total time spent reviewing records, interview, history/exam, documentation, and coordination of care on day of encounter:  50 min   Thank you for allowing me to participate in patient's care.  If I can  answer any additional questions, I would be pleased to do so.  Jacquelyne Balint, MD   CC: Veto Kemps Bertram Millard, MD [address] [place] [address]  CC: Referring provider: Loyola Mast, MD [address] [place],  [place] [address]

## 2022-09-07 NOTE — Telephone Encounter (Signed)
Pt wife called and said that her husband don't want metforman he rather have rybelsus

## 2022-09-08 ENCOUNTER — Other Ambulatory Visit: Payer: Self-pay | Admitting: Family Medicine

## 2022-09-08 DIAGNOSIS — E119 Type 2 diabetes mellitus without complications: Secondary | ICD-10-CM

## 2022-09-08 NOTE — Telephone Encounter (Signed)
Wife called back and stated that the insurance comp told them that they will cover Jardiance. They just need the PA for it. Ins Co # [phone]

## 2022-09-08 NOTE — Telephone Encounter (Signed)
Spoke to patient, he will call his wife and she is she can call the insurance to see what they will cover.  He did express to me that he thought Metoformin cause kidney disease.  Advise that it wasn't true and that is usually the first line of medication to start for this.  Dm/cma

## 2022-09-08 NOTE — Telephone Encounter (Signed)
The wife called to request that a letter be sent to the insurance company stating that rybellsus is not just weight loss it is a diabetic med in this case.

## 2022-09-09 NOTE — Telephone Encounter (Signed)
Patients wife will call back and schedule an appointment.   Dm/cma

## 2022-09-09 NOTE — Telephone Encounter (Signed)
Caller states she was informed that a Prior Authorization is needed for the jardiance. Caller is requesting this be done as soon as possible.

## 2022-09-09 NOTE — Telephone Encounter (Signed)
Spoke to pharmacy, he will have to do step therapy.  Called patient and will call back to make an appointment to come in to discuss options. Dm/cma

## 2022-09-14 ENCOUNTER — Encounter: Payer: Self-pay | Admitting: Neurology

## 2022-09-14 ENCOUNTER — Ambulatory Visit: Payer: BC Managed Care – PPO | Admitting: Family Medicine

## 2022-09-14 ENCOUNTER — Encounter: Payer: Self-pay | Admitting: Family Medicine

## 2022-09-14 ENCOUNTER — Other Ambulatory Visit (INDEPENDENT_AMBULATORY_CARE_PROVIDER_SITE_OTHER): Payer: BC Managed Care – PPO

## 2022-09-14 ENCOUNTER — Ambulatory Visit: Payer: BC Managed Care – PPO | Admitting: Neurology

## 2022-09-14 VITALS — BP 130/82 | HR 82 | Ht 71.0 in | Wt 295.0 lb

## 2022-09-14 VITALS — BP 134/86 | HR 82 | Temp 98.0°F | Ht 71.0 in | Wt 292.8 lb

## 2022-09-14 DIAGNOSIS — H02403 Unspecified ptosis of bilateral eyelids: Secondary | ICD-10-CM

## 2022-09-14 DIAGNOSIS — H532 Diplopia: Secondary | ICD-10-CM

## 2022-09-14 DIAGNOSIS — Z7984 Long term (current) use of oral hypoglycemic drugs: Secondary | ICD-10-CM | POA: Diagnosis not present

## 2022-09-14 DIAGNOSIS — E119 Type 2 diabetes mellitus without complications: Secondary | ICD-10-CM

## 2022-09-14 DIAGNOSIS — G7000 Myasthenia gravis without (acute) exacerbation: Secondary | ICD-10-CM

## 2022-09-14 MED ORDER — METFORMIN HCL 500 MG PO TABS: 500.0000 mg | ORAL_TABLET | Freq: Every day | ORAL | 3 refills | Status: DC

## 2022-09-14 MED ORDER — PREDNISONE 20 MG PO TABS: ORAL_TABLET | ORAL | 0 refills | Status: DC

## 2022-09-14 MED ORDER — PYRIDOSTIGMINE BROMIDE 60 MG PO TABS: 60.0000 mg | ORAL_TABLET | Freq: Three times a day (TID) | ORAL | 5 refills | Status: DC

## 2022-09-14 NOTE — Assessment & Plan Note (Signed)
We discussed the initial, first-line approach to diabetes. I explained that there is no medical evidence that metformin causes renal failure. I pointed out it's proven safety record and its impact on reducing complications. I also discussed that this was a more affordable alternative to newer agents. He notes that Rybelsus would have cost him $1200/mo out of pocket. I provided him with a recent editorial from the American Family Physician journal the evidence for metformin as a first-line treatment for diabetes. He is agreeable to giving this a trial.

## 2022-09-14 NOTE — Progress Notes (Signed)
[facility] [address] [place] [address] Dept: [phone] Dept Fax: [phone]  Chronic Care Office Visit  Subjective:    Patient ID: Terrence Hoover, male    DOB: 1980-06-29, 42 y.o..   MRN: [id number]  Chief Complaint  Patient presents with   Medical Management of Chronic Issues    Discuss diabetes medications.    History of Present Illness:  Patient is in today for reassessment of chronic medical issues.  Mr. Abe presents today to discuss the results of his recent testing confirming diabetes. His wife has had a strong aversion to treatment with metformin due to a belief that this causes renal failure. At his request,w e had tried to send in prescriptions for semaglutide (Rybelsus) and empagliflozin (Jardiance). Both of these were denied by his insurance due to lack of having a trial of metformin.  Past Medical History: Patient Active Problem List   Diagnosis Date Noted   Ocular myasthenia gravis (HCC) 08/03/2022   Type 2 diabetes mellitus (HCC) 08/03/2022   Chronic right-sided low back pain with right-sided sciatica 07/16/2021   Androgen deficiency 02/07/2020   Vitamin D deficiency 02/07/2020   Circadian rhythm sleep disorder, shift work type 01/16/2019   Morbid obesity with body mass index (BMI) of 45.0 to 49.9 in adult (HCC) 01/16/2019   Nocturia more than twice per night 01/16/2019   Episodic cluster headache, not intractable 01/16/2019   Severe obstructive sleep apnea-hypopnea syndrome 01/16/2019   Insomnia 11/27/2018   Essential hypertension 12/01/2015   Past Surgical History:  Procedure Laterality Date   SHOULDER ARTHROSCOPY W/ ROTATOR CUFF REPAIR Right    Family History  Problem Relation Age of Onset   Hypertension Mother    Alcohol abuse Father    Hypertension Maternal Grandmother    Heart disease Maternal Grandfather    COPD Maternal Grandfather    Cancer Maternal Grandfather         Skin, lung   Hypertension Maternal Grandfather    Stroke Maternal Grandfather    Heart attack Maternal Grandfather    Outpatient Medications Prior to Visit  Medication Sig Dispense Refill   amLODipine (NORVASC) 5 MG tablet Take 1 tablet (5 mg total) by mouth daily. 30 tablet 3   aspirin EC 325 MG tablet Take 325 mg by mouth daily.     empagliflozin (JARDIANCE) 10 MG TABS tablet Take 1 tablet (10 mg total) by mouth daily before breakfast. (Patient not taking: Reported on 09/14/2022) 30 tablet 5   No facility-administered medications prior to visit.   Allergies  Allergen Reactions   Morphine Anaphylaxis and Shortness Of Breath    "stops breathing" "stops breathing"    Morphine And Related Anaphylaxis   Venomil Honey Bee Venom  [Honey Bee Venom] Anaphylaxis   Objective:   Today's Vitals   09/14/22 0923  BP: 134/86  Pulse: 82  Temp: 98 F (36.7 C)  TempSrc: Temporal  SpO2: 97%  Weight: 292 lb 12.8 oz (132.8 kg)  Height: 5\' 11"  (1.803 m)   Body mass index is 40.84 kg/m.   General: Well developed, well nourished. No acute distress. Psych: Alert and oriented. Normal mood and affect.  Health Maintenance Due  Topic Date Due   FOOT EXAM  Never done   OPHTHALMOLOGY EXAM  Never done   HIV Screening  Never done   Diabetic kidney evaluation - Urine ACR  Never done   Hepatitis C Screening  Never done   Lab Results Component Ref  Range & Units 7 d ago 1 mo ago  Hgb A1c MFr Bld 4.6 - 6.5 % 7.0 High  6.5 CM   Assessment & Plan:   Problem List Items Addressed This Visit       Endocrine   Type 2 diabetes mellitus (HCC) - Primary    We discussed the initial, first-line approach to diabetes. I explained that there is no medical evidence that metformin causes renal failure. I pointed out it's proven safety record and its impact on reducing complications. I also discussed that this was a more affordable alternative to newer agents. He notes that Rybelsus would have cost him  $1200/mo out of pocket. I provided him with a recent editorial from the American Family Physician journal the evidence for metformin as a first-line treatment for diabetes. He is agreeable to giving this a trial.      Relevant Medications   metFORMIN (GLUCOPHAGE) 500 MG tablet    Return in about 3 months (around 12/15/2022) for Reassessment.   Loyola Mast, MD

## 2022-09-14 NOTE — Patient Instructions (Signed)
Your symptoms are due to a muscle weakness disease called myasthenia gravis. This is an autoimmune disease.  I need to evaluate further with the following: -Blood work today -CT of your chest to ensure there is no problem with your thymus gland  I will be in touch when I have your results.  To treat the disease: -Start mestinon 60 mg morning, mid-day, and mid-afternoon. This medication can cause GI upset and diarrhea. -Start prednisone (steroid) 20 mg once in the morning for 1 week, then increase to 2 tablets (40 mg) in the morning thereafter until you follow up with me.  If you had severe weakness, difficulty breathing, or difficulty swallowing this could be an emergency. Call EMS and go to the nearest emergency room.  I want to see you back in clinic in 1 month.  The physicians and staff at [facility] are committed to providing excellent care. You may receive a survey requesting feedback about your experience at our office. We strive to receive "very good" responses to the survey questions. If you feel that your experience would prevent you from giving the office a "very good " response, please contact our office to try to remedy the situation. We may be reached at [phone]. Thank you for taking the time out of your busy day to complete the survey.  Jacquelyne Balint, MD [facility]

## 2022-09-16 ENCOUNTER — Telehealth: Payer: Self-pay | Admitting: Family Medicine

## 2022-09-16 NOTE — Telephone Encounter (Signed)
Morrie Sheldon from [facility] called and said that the patient was just there 4.2.24 and they did not know he was a diabetic and they did not do a diabetic eye exam but they can fax over the information they have . Morrie Sheldon can be reached at [phone]

## 2022-09-19 NOTE — Telephone Encounter (Signed)
Noted. Dm/cma  

## 2022-09-27 ENCOUNTER — Ambulatory Visit: Payer: BC Managed Care – PPO | Admitting: Dermatology

## 2022-09-27 ENCOUNTER — Other Ambulatory Visit: Payer: Self-pay | Admitting: Neurology

## 2022-09-27 ENCOUNTER — Encounter: Payer: Self-pay | Admitting: Dermatology

## 2022-09-27 DIAGNOSIS — D489 Neoplasm of uncertain behavior, unspecified: Secondary | ICD-10-CM

## 2022-09-27 DIAGNOSIS — L720 Epidermal cyst: Secondary | ICD-10-CM

## 2022-09-27 NOTE — Progress Notes (Signed)
   New Patient Visit   Subjective  Terrence Hoover is a 42 y.o. male who presents for the following: Abscess  Spot under left eye presented itself about a year ago. Started like a pimple but grew. Does not drain and is not painful. Spot under right eye presented itself a couple weeks ago. Has tried to pop them but nothing comes out.   The following portions of the chart were reviewed this encounter and updated as appropriate: medications, allergies, medical history  Review of Systems:  No other skin or systemic complaints except as noted in HPI or Assessment and Plan.  Objective  Well appearing patient in no apparent distress; mood and affect are within normal limits.  A focused examination was performed of the following areas: Face  Relevant exam findings are noted in the Assessment and Plan.  Left Malar Cheek White papule     Assessment & Plan  Milia Exam: Firm white papules  Treatment Plan: -Reassurance    Neoplasm of uncertain behavior Left Malar Cheek  Skin / nail biopsy Type of biopsy: tangential   Informed consent: discussed and consent obtained   Patient was prepped and draped in usual sterile fashion: Area prepped with alcohol. Anesthesia: the lesion was anesthetized in a standard fashion   Anesthetic:  1% lidocaine w/ epinephrine 1-100,000 buffered w/ 8.4% NaHCO3 Instrument used: flexible razor blade   Hemostasis achieved with: pressure, aluminum chloride and electrodesiccation   Outcome: patient tolerated procedure well   Post-procedure details: wound care instructions given   Post-procedure details comment:  Ointment and small bandage applied    No follow-ups on file.    Documentation: I have reviewed the above documentation for accuracy and completeness, and I agree with the above.  Langston Reusing, DO  I, Germaine Pomfret, CMA, am acting as scribe for [employer], DO.

## 2022-09-27 NOTE — Patient Instructions (Signed)
Patient Handout: Wound Care for Skin Biopsy Site  Patient Handout: Wound Care for Skin Biopsy Site  Taking Care of Your Skin Biopsy Site  Proper care of the biopsy site is essential for promoting healing and minimizing scarring. This handout provides instructions on how to care for your biopsy site to ensure optimal recovery.  1. Cleaning the Wound:  Clean the biopsy site daily with gentle soap and water. Gently pat the area dry with a clean, soft towel. Avoid harsh scrubbing or rubbing the area, as this can irritate the skin and delay healing.  2. Applying Aquaphor and Bandage:  After cleaning the wound, apply a thin layer of Aquaphor ointment to the biopsy site. Cover the area with a sterile bandage to protect it from dirt, bacteria, and friction. Change the bandage daily or as needed if it becomes soiled or wet.  3. Continued Care for One Week:  Repeat the cleaning, Aquaphor application, and bandaging process daily for one week following the biopsy procedure. Keeping the wound clean and moist during this initial healing period will help prevent infection and promote optimal healing.  4. Massaging Aquaphor into the Area:  ---After one week, discontinue the use of bandages but continue to apply Aquaphor to the biopsy site. ----Gently massage the Aquaphor into the area using circular motions. ---Massaging the skin helps to promote circulation and prevent the formation of scar tissue.   Additional Tips:  Avoid exposing the biopsy site to direct sunlight during the healing process, as this can cause hyperpigmentation or worsen scarring. If you experience any signs of infection, such as increased redness, swelling, warmth, or drainage from the wound, contact your healthcare provider immediately. Follow any additional instructions provided by your healthcare provider for caring for the biopsy site and managing any discomfort. Conclusion:  Taking proper care of your skin biopsy site  is crucial for ensuring optimal healing and minimizing scarring. By following these instructions for cleaning, applying Aquaphor, and massaging the area, you can promote a smooth and successful recovery. If you have any questions or concerns about caring for your biopsy site, don't hesitate to contact your healthcare provider for guidance.      Due to recent changes in healthcare laws, you may see results of your pathology and/or laboratory studies on MyChart before the doctors have had a chance to review them. We understand that in some cases there may be results that are confusing or concerning to you. Please understand that not all results are received at the same time and often the doctors may need to interpret multiple results in order to provide you with the best plan of care or course of treatment. Therefore, we ask that you please give us 2 business days to thoroughly review all your results before contacting the office for clarification. Should we see a critical lab result, you will be contacted sooner.   If You Need Anything After Your Visit  If you have any questions or concerns for your doctor, please call our main line at [phone] If no one answers, please leave a voicemail as directed and we will return your call as soon as possible. Messages left after 4 pm will be answered the following business day.   You may also send us a message via MyChart. We typically respond to MyChart messages within 1-2 business days.  For prescription refills, please ask your pharmacy to contact our office. Our fax number is [phone].  If you have an urgent issue when the clinic is   closed that cannot wait until the next business day, you can page your doctor at the number below.    Please note that while we do our best to be available for urgent issues outside of office hours, we are not available 24/7.   If you have an urgent issue and are unable to reach us, you may choose to seek medical care  at your doctor's office, retail clinic, urgent care center, or emergency room.  If you have a medical emergency, please immediately call 911 or go to the emergency department. In the event of inclement weather, please call our main line at [phone] for an update on the status of any delays or closures.  Dermatology Medication Tips: Please keep the boxes that topical medications come in in order to help keep track of the instructions about where and how to use these. Pharmacies typically print the medication instructions only on the boxes and not directly on the medication tubes.   If your medication is too expensive, please contact our office at [phone] or send us a message through MyChart.   We are unable to tell what your co-pay for medications will be in advance as this is different depending on your insurance coverage. However, we may be able to find a substitute medication at lower cost or fill out paperwork to get insurance to cover a needed medication.   If a prior authorization is required to get your medication covered by your insurance company, please allow us 1-2 business days to complete this process.  Drug prices often vary depending on where the prescription is filled and some pharmacies may offer cheaper prices.  The website www.goodrx.com contains coupons for medications through different pharmacies. The prices here do not account for what the cost may be with help from insurance (it may be cheaper with your insurance), but the website can give you the price if you did not use any insurance.  - You can print the associated coupon and take it with your prescription to the pharmacy.  - You may also stop by our office during regular business hours and pick up a GoodRx coupon card.  - If you need your prescription sent electronically to a different pharmacy, notify our office through [facility] MyChart or by phone at [phone]     

## 2022-09-28 ENCOUNTER — Encounter: Payer: Self-pay | Admitting: Neurology

## 2022-09-28 ENCOUNTER — Ambulatory Visit
Admission: RE | Admit: 2022-09-28 | Discharge: 2022-09-28 | Disposition: A | Discharge status: Home / Self Care | Payer: BC Managed Care – PPO | Source: Ambulatory Visit | Attending: Neurology | Admitting: Neurology

## 2022-09-28 DIAGNOSIS — G7000 Myasthenia gravis without (acute) exacerbation: Secondary | ICD-10-CM | POA: Diagnosis not present

## 2022-09-28 DIAGNOSIS — R911 Solitary pulmonary nodule: Secondary | ICD-10-CM | POA: Diagnosis not present

## 2022-09-28 DIAGNOSIS — K760 Fatty (change of) liver, not elsewhere classified: Secondary | ICD-10-CM | POA: Diagnosis not present

## 2022-09-28 DIAGNOSIS — H532 Diplopia: Secondary | ICD-10-CM

## 2022-09-28 DIAGNOSIS — H02403 Unspecified ptosis of bilateral eyelids: Secondary | ICD-10-CM

## 2022-09-28 MED ORDER — IOPAMIDOL (ISOVUE-300) INJECTION 61%
100.0000 mL | Freq: Once | INTRAVENOUS | Status: AC | PRN
  Administered 2022-09-28: 75 mL via INTRAVENOUS

## 2022-09-30 ENCOUNTER — Other Ambulatory Visit: Payer: BC Managed Care – PPO

## 2022-10-03 NOTE — Progress Notes (Signed)
Hi Jamey Ripa  Dr. Onalee Hua reviewed your biopsy results and it showed the spot removed was a benign cyst (not cancerous).  No additional treatment is required.  The detailed report is available to view in MyChart.  Have a great day!  Kind Regards,  Dr. Kermit Balo Care Team

## 2022-10-05 ENCOUNTER — Ambulatory Visit: Payer: BC Managed Care – PPO | Admitting: Family Medicine

## 2022-10-05 ENCOUNTER — Encounter: Payer: Self-pay | Admitting: Family Medicine

## 2022-10-05 VITALS — BP 136/74 | HR 83 | Temp 97.8°F | Ht 71.0 in | Wt 291.4 lb

## 2022-10-05 DIAGNOSIS — E119 Type 2 diabetes mellitus without complications: Secondary | ICD-10-CM | POA: Diagnosis not present

## 2022-10-05 DIAGNOSIS — R911 Solitary pulmonary nodule: Secondary | ICD-10-CM | POA: Diagnosis not present

## 2022-10-05 DIAGNOSIS — G7000 Myasthenia gravis without (acute) exacerbation: Secondary | ICD-10-CM

## 2022-10-05 DIAGNOSIS — Z7984 Long term (current) use of oral hypoglycemic drugs: Secondary | ICD-10-CM

## 2022-10-05 DIAGNOSIS — I10 Essential (primary) hypertension: Secondary | ICD-10-CM | POA: Diagnosis not present

## 2022-10-05 LAB — MICROALBUMIN / CREATININE URINE RATIO
Creatinine,U: 212 mg/dL
Microalb Creat Ratio: 1.7 mg/g (ref 0.0–30.0)
Microalb, Ur: 3.5 mg/dL — ABNORMAL HIGH (ref 0.0–1.9)

## 2022-10-05 NOTE — Assessment & Plan Note (Signed)
We will plan for follow-up scan in 2 years.

## 2022-10-05 NOTE — Progress Notes (Signed)
[facility] [address] [place] [address] Dept: [phone] Dept Fax: [phone]  Chronic Care Office Visit  Subjective:    Patient ID: Terrence Hoover, male    DOB: 1980-06-27, 42 y.o..   MRN: [id number]  Chief Complaint  Patient presents with   Medical Management of Chronic Issues    1 month f/u Diabetes.  No concerns.    History of Present Illness:  Patient is in today for reassessment of chronic medical issues.  Terrence Hoover was diagnosed with Type 2 diabetes in April. We did initiate him on metformin 500 mg daily. Unfortunately, this coincided with him being placed on prednisone. He notes that he has been urinating more frequently of late. He remains torn about taking metformin.  Terrence Hoover has a history of hypertension. He is managed on amlodipine 5 mg daily.  Terrence Hoover was developed ocular myasthenia gravis in late March. He is now engaged with neurology. He is being treated with pyridostigmine 60 mg TID and prednisone 40 mg daily. He is having improvements, but has not had resolution of his ptosis. He finds he can feel the pyridostigmine wearing off prior to the next dose. He sees the neurologist back in 2 weeks.  Past Medical History: Patient Active Problem List   Diagnosis Date Noted   Ocular myasthenia gravis (HCC) 08/03/2022   Type 2 diabetes mellitus (HCC) 08/03/2022   Chronic right-sided low back pain with right-sided sciatica 07/16/2021   Androgen deficiency 02/07/2020   Vitamin D deficiency 02/07/2020   Circadian rhythm sleep disorder, shift work type 01/16/2019   Morbid obesity with body mass index (BMI) of 45.0 to 49.9 in adult (HCC) 01/16/2019   Nocturia more than twice per night 01/16/2019   Episodic cluster headache, not intractable 01/16/2019   Severe obstructive sleep apnea-hypopnea syndrome 01/16/2019   Insomnia 11/27/2018   Essential hypertension 12/01/2015   Past Surgical History:   Procedure Laterality Date   ACNE CYST REMOVAL     arround eye   SHOULDER ARTHROSCOPY W/ ROTATOR CUFF REPAIR Right    Family History  Problem Relation Age of Onset   Hypertension Mother    Alcohol abuse Father    Hypertension Maternal Grandmother    Heart disease Maternal Grandfather    COPD Maternal Grandfather    Cancer Maternal Grandfather        Skin, lung   Hypertension Maternal Grandfather    Stroke Maternal Grandfather    Heart attack Maternal Grandfather    Outpatient Medications Prior to Visit  Medication Sig Dispense Refill   amLODipine (NORVASC) 5 MG tablet Take 1 tablet (5 mg total) by mouth daily. 30 tablet 3   aspirin EC 325 MG tablet Take 325 mg by mouth as needed.     metFORMIN (GLUCOPHAGE) 500 MG tablet Take 1 tablet (500 mg total) by mouth daily with breakfast. 180 tablet 3   predniSONE (DELTASONE) 20 MG tablet Take 1 tablet (20 mg total) by mouth daily for 7 days, THEN 2 tablets (40 mg total) daily. 97 tablet 0   pyridostigmine (MESTINON) 60 MG tablet Take 1 tablet (60 mg total) by mouth 3 (three) times daily. 90 tablet 5   No facility-administered medications prior to visit.   Allergies  Allergen Reactions   Morphine Anaphylaxis and Shortness Of Breath    "stops breathing" "stops breathing"    Morphine And Codeine Anaphylaxis   Venomil Honey Bee Venom  [Honey Bee Venom] Anaphylaxis   Objective:  Today's Vitals   10/05/22 1327  BP: 136/74  Pulse: 83  Temp: 97.8 F (36.6 C)  TempSrc: Temporal  SpO2: 97%  Weight: 291 lb 6.4 oz (132.2 kg)  Height: 5\' 11"  (1.803 m)   Body mass index is 40.64 kg/m.   General: Well developed, well nourished. No acute distress. Feet- Skin intact. No sign of maceration between toes. Nails are normal. Dorsalis pedis and posterior tibial artery pulses   are normal. 5.07 monofilament testing normal. Psych: Alert and oriented. Normal mood and affect.  Health Maintenance Due  Topic Date Due   OPHTHALMOLOGY EXAM   Never done   HIV Screening  Never done   Diabetic kidney evaluation - Urine ACR  Never done   Hepatitis C Screening  Never done   Imaging: CT of Chest w contrast (09/28/2022) IMPRESSION: 1. No evidence of thymic nodule, mass, or hyperplasia. 2. 3 mm right middle lobe pulmonary nodule. Follow-up by chest CT without contrast is recommended in 2 years, and again at 4 years to confirm stability. This recommendation follows the consensus statement: Recommendations for the Management of Subsolid Pulmonary Nodules Detected at CT: A Statement from the Fleischner Society as published in Radiology 2013; 266:304-317. 3. Hepatic steatosis.    Assessment & Plan:   Problem List Items Addressed This Visit       Cardiovascular and Mediastinum   Essential hypertension    Blood pressure is in adequate control. Prednisone likely increases this. Continue amlodipine 5 mg daily.        Respiratory   Pulmonary nodule less than 6 mm determined by computed tomography of lung    We will plan for follow-up scan in 2 years.        Endocrine   Type 2 diabetes mellitus (HCC)    Prednisone may be increasing blood sugars somewhat. I will continue metformin 500 mg daily. Plan repeat A1c in 2 months.      Relevant Orders   Microalbumin / creatinine urine ratio     Nervous and Auditory   Ocular myasthenia gravis (HCC) - Primary    Some improvements noted. Continue pyridostigmine 60 mg TID and prednisone 40 mg daily. Will follow with Dr. Loleta Chance.       Return in about 2 months (around 12/05/2022) for Reassessment.   Loyola Mast, MD

## 2022-10-05 NOTE — Assessment & Plan Note (Signed)
Some improvements noted. Continue pyridostigmine 60 mg TID and prednisone 40 mg daily. Will follow with Dr. Loleta Chance.

## 2022-10-05 NOTE — Assessment & Plan Note (Signed)
Prednisone may be increasing blood sugars somewhat. I will continue metformin 500 mg daily. Plan repeat A1c in 2 months.

## 2022-10-05 NOTE — Assessment & Plan Note (Signed)
Blood pressure is in adequate control. Prednisone likely increases this. Continue amlodipine 5 mg daily.

## 2022-10-07 NOTE — Progress Notes (Signed)
I saw Terrence Hoover in neurology clinic on 10/21/22 in follow up for AChR ab positive myasthenia gravis.  HPI: Terrence Hoover is a 42 y.o. year old male with a history of DM, OSA (does not have anymore per patient), androgen deficiency (previously treated with testosterone), vit D deficiency, HTN who we last saw on 09/14/22.  To briefly review: Patient is having difficulty opening his eyelids and diplopia. It started about 1.5 months ago. The diplopia goes away by covering one eye. Both symptoms fluctuate. He thinks symptoms are worse when driving. He thinks the mornings and evenings are okay. It gets worse as the day goes on. He first thought it was a stye in his eye. It did not get better, so he saw his PCP who felt this was ocular myasthenia gravis. He also saw ophtho who also felt it was OMG. AChR abs were sent and positive (blocking only sent, elevated to 21).   Current MG symptoms: Ptosis: yes, see above Double vision: yes, see above Speech: sometimes feels like he has slurred speech. If he stops and thinks, it improves. Chewing: No problems Swallowing: No problems Breathing: No problems including orthopnea Arm strength: No weakness Leg strength: No weakness   He does not report any constitutional symptoms like fever, night sweats, anorexia or unintentional weight loss.   EtOH use: None  Restrictive diet? No  Family history of neuropathy/myopathy/NM disease? No  Most recent Assessment and Plan (09/14/22): His neurological examination is pertinent for severe ptosis bilaterally, facial weakness, and severe extraocular muscle weakness. Available diagnostic data is significant for AChR blocking antibodies positive (21), HbA1c of 7. Patient's symptoms are consistent with myasthenia gravis, at least ocular, perhaps generalized given intermittent slurred speech.   PLAN: -Blood work: AChR abs (binding, blocking, modulating) -CT chest w/wo contrast -Start mestinon 60 mg TID -Start  prednisone 20 mg daily for 1 week, then increase to 40 mg daily -May need steroid sparing agent if not able to quickly taper down steroids given DM and HTN -Will discuss thymectomy at next appointment  Since their last visit: CT chest showed no evidence of thymoma. AChR abs were positive.  Overall, patient is doing really well. He has only rare double vision.  Current MG symptoms: Ptosis: None Double vision: Rare Speech: None Chewing: None Swallowing: None Breathing: None Arm strength: None Leg strength: None   Current medications: -Prednisone 40 mg daily -Mestinon 60 mg TID  Side effects: none   MEDICATIONS:  Outpatient Encounter Medications as of 10/21/2022  Medication Sig   amLODipine (NORVASC) 5 MG tablet TAKE 1 TABLET (5 MG TOTAL) BY MOUTH DAILY.   aspirin EC 325 MG tablet Take 325 mg by mouth as needed.   metFORMIN (GLUCOPHAGE) 500 MG tablet Take 1 tablet (500 mg total) by mouth daily with breakfast.   predniSONE (DELTASONE) 20 MG tablet Take 1 tablet (20 mg total) by mouth daily for 7 days, THEN 2 tablets (40 mg total) daily.   pyridostigmine (MESTINON) 60 MG tablet Take 1 tablet (60 mg total) by mouth 3 (three) times daily.   [DISCONTINUED] amLODipine (NORVASC) 5 MG tablet Take 1 tablet (5 mg total) by mouth daily.   No facility-administered encounter medications on file as of 10/21/2022.    PAST MEDICAL HISTORY: Past Medical History:  Diagnosis Date   Androgen deficiency    Diabetes mellitus without complication (HCC)    Hypertension    OSA (obstructive sleep apnea)    Vitamin D deficiency     PAST  SURGICAL HISTORY: Past Surgical History:  Procedure Laterality Date   ACNE CYST REMOVAL     arround eye   SHOULDER ARTHROSCOPY W/ ROTATOR CUFF REPAIR Right     ALLERGIES: Allergies  Allergen Reactions   Morphine Anaphylaxis and Shortness Of Breath    "stops breathing" "stops breathing"    Morphine And Codeine Anaphylaxis   Venomil Honey Bee Venom   [Honey Bee Venom] Anaphylaxis    FAMILY HISTORY: Family History  Problem Relation Age of Onset   Hypertension Mother    Alcohol abuse Father    Hypertension Maternal Grandmother    Heart disease Maternal Grandfather    COPD Maternal Grandfather    Cancer Maternal Grandfather        Skin, lung   Hypertension Maternal Grandfather    Stroke Maternal Grandfather    Heart attack Maternal Grandfather     SOCIAL HISTORY: Social History   Tobacco Use   Smoking status: Never   Smokeless tobacco: Never  Vaping Use   Vaping Use: Never used  Substance Use Topics   Alcohol use: No   Drug use: Never   Social History   Social History Narrative   Are you right handed or left handed? Left   Are you currently employed ?    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 no   Who lives with you? Wife and 3 kids   What type of home do you live in: 1 story or 2 story? One story    Caffeine occasionally    Objective:  Vital Signs:  BP (!) 148/89   Pulse 86   Ht 5' 11.5" (1.816 m)   Wt 297 lb 9.6 oz (135 kg)   SpO2 96%   BMI 40.93 kg/m   General: General appearance: Awake and alert. No distress. Cooperative with exam.  Skin: No obvious rash or jaundice. HEENT: Atraumatic. Anicteric. Lungs: Non-labored breathing on room air  Extremities: No edema. No obvious deformity.  Musculoskeletal: No obvious joint swelling.  Neurological: Mental Status: Alert. Speech fluent. No pseudobulbar affect Cranial Nerves: CNII: No RAPD. Visual fields intact. CNIII, IV, VI: PERRL. No nystagmus. EOMI. No diplopia  CN V: Facial sensation intact bilaterally to fine touch. Masseter clench strong. CN VII: Facial muscles symmetric and strong including orbicularis oculi and orbicularis oris. No ptosis at rest or after sustained up gaze. CN VIII: Hears finger rub well bilaterally. CN IX: No hypophonia. CN X: Palate elevates symmetrically. CN XI: Full strength shoulder  shrug bilaterally. CN XII: Tongue protrusion full and midline. No atrophy or fasciculations. No significant dysarthria Motor: Tone is normal. Strength is 5/5 in bilateral upper and lower extremities. Reflexes:  Right Left  Bicep 2+ 2+  Tricep 2+ 2+  BrRad 2+ 2+  Knee 2+ 2+  Ankle 2+ 2+   Sensation: Intact to light touch in all extremities Coordination: Intact finger-to- nose-finger bilaterally. Romberg negative. Gait: Normal, narrow-based gait.    Lab and Test Review: New results: AChR abs (09/14/22): positive: binding elevated to 5.28, blocking elevated to 40, modulating elevated to 71  CT chest w/wo contrast (09/28/22): FINDINGS: Cardiovascular: The heart is normal in size and there is no pericardial effusion. The aorta and pulmonary trunk are normal in caliber.   Mediastinum/Nodes: No mediastinal, hilar, or axillary lymphadenopathy. The thyroid gland, trachea, and esophagus are within normal limits. There is no evidence of thymic nodule or mass or hyperplasia.   Lungs/Pleura: 3 mm nodule is present  in the right middle lobe, axial image 78. No effusion or pneumothorax.   Upper Abdomen: There is fatty infiltration of the liver. No acute abnormality.   Musculoskeletal: Degenerative changes are present in the thoracic spine. No acute or suspicious osseous abnormality is seen.   IMPRESSION: 1. No evidence of thymic nodule, mass, or hyperplasia. 2. 3 mm right middle lobe pulmonary nodule. Follow-up by chest CT without contrast is recommended in 2 years, and again at 4 years to confirm stability. This recommendation follows the consensus statement: Recommendations for the Management of Subsolid Pulmonary Nodules Detected at CT: A Statement from the Fleischner Society as published in Radiology 2013; 266:304-317. 3. Hepatic steatosis.  Previously reviewed results: 08/03/22: TSH: 2.00 AChR blocking antibodies: elevated to 21 CMP significant for elevated glucose CBC wnl    HbA1c (09/07/22): 7.0   Vit D (02/06/2020): 10.13   Imaging: EMG (10/21/19 by Dr. 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  The EMG evaluation of the right upper extremity is unremarkable, no evidence of an overlying cervical radiculopathy is seen.  Clinical evaluation this patient suggests a possible right lateral epicondylitis.  ASSESSMENT: [name], a 42 y.o. male with AChR ab positive myasthenia gravis. He has at least ocular myasthenia gravis and may have generalized given the slurred speech present at symptom onset. CT chest showed no thymoma. After discussion, patient is interested in thymectomy. I will send this referral. Fortunately, he has responded very well to steroids and mestinon and appears symptom free today.  Plan: -Reduced prednisone to 20 mg daily starting tomorrow. Take for 1 month then decrease to 10 mg daily (11/20/22) and continue this until follow up. -Continue mestinon 60 mg TID -Lung nodule being followed by PCP - follow up scan in 2 years -Patient interested in thymectomy - will send referral today to cardiothoracic surgery to discuss  Return to clinic in 3 months  Total time spent reviewing records, interview, history/exam, documentation, and coordination of care on day of encounter:  30 min  Jacquelyne Balint, MD

## 2022-10-08 ENCOUNTER — Other Ambulatory Visit: Payer: Self-pay | Admitting: Neurology

## 2022-10-08 DIAGNOSIS — G7000 Myasthenia gravis without (acute) exacerbation: Secondary | ICD-10-CM

## 2022-10-08 DIAGNOSIS — H02403 Unspecified ptosis of bilateral eyelids: Secondary | ICD-10-CM

## 2022-10-08 DIAGNOSIS — H532 Diplopia: Secondary | ICD-10-CM

## 2022-10-08 LAB — MYASTHENIA GRAVIS PANEL 2
A CHR BINDING ABS: 5.28 nmol/L — ABNORMAL HIGH
ACHR Blocking Abs: 40 % Inhibition — ABNORMAL HIGH (ref <15)
Acetylchol Modul Ab: 71 % Inhibition — ABNORMAL HIGH

## 2022-10-12 ENCOUNTER — Other Ambulatory Visit: Payer: Self-pay | Admitting: Family Medicine

## 2022-10-12 ENCOUNTER — Other Ambulatory Visit: Payer: Self-pay | Admitting: Neurology

## 2022-10-12 DIAGNOSIS — H532 Diplopia: Secondary | ICD-10-CM

## 2022-10-12 DIAGNOSIS — H02403 Unspecified ptosis of bilateral eyelids: Secondary | ICD-10-CM

## 2022-10-12 DIAGNOSIS — G7000 Myasthenia gravis without (acute) exacerbation: Secondary | ICD-10-CM

## 2022-10-12 DIAGNOSIS — I10 Essential (primary) hypertension: Secondary | ICD-10-CM

## 2022-10-21 ENCOUNTER — Encounter: Payer: Self-pay | Admitting: Neurology

## 2022-10-21 ENCOUNTER — Ambulatory Visit: Payer: BC Managed Care – PPO | Admitting: Neurology

## 2022-10-21 VITALS — BP 148/89 | HR 86 | Ht 71.5 in | Wt 297.6 lb

## 2022-10-21 DIAGNOSIS — G7000 Myasthenia gravis without (acute) exacerbation: Secondary | ICD-10-CM

## 2022-10-21 DIAGNOSIS — H02403 Unspecified ptosis of bilateral eyelids: Secondary | ICD-10-CM | POA: Diagnosis not present

## 2022-10-21 DIAGNOSIS — H532 Diplopia: Secondary | ICD-10-CM | POA: Diagnosis not present

## 2022-10-21 DIAGNOSIS — R471 Dysarthria and anarthria: Secondary | ICD-10-CM

## 2022-10-21 MED ORDER — PREDNISONE 10 MG PO TABS: 10.0000 mg | ORAL_TABLET | Freq: Every day | ORAL | 2 refills | Status: DC

## 2022-10-21 NOTE — Patient Instructions (Signed)
You are looking great today.  We will reduce your prednisone starting tomorrow to 20 mg daily. Take this for 1 month, then reduce to 10 mg daily. I sent a new prescription for 10 mg tablets to your pharmacy that will start on 11/20/22.  If your symptoms worsen, go up to previous dose of prednisone and call me or send me a message.  Continue mestinon 60 mg three times daily.  You expressed interested in thymectomy. I will send a referral to a surgeon for you to discuss this further.   I want to see you back in 3 months or sooner if needed.  The physicians and staff at [facility] are committed to providing excellent care. You may receive a survey requesting feedback about your experience at our office. We strive to receive "very good" responses to the survey questions. If you feel that your experience would prevent you from giving the office a "very good " response, please contact our office to try to remedy the situation. We may be reached at [phone]. Thank you for taking the time out of your busy day to complete the survey.  Jacquelyne Balint, MD [facility]

## 2022-11-08 ENCOUNTER — Telehealth: Payer: Self-pay | Admitting: Neurology

## 2022-11-08 NOTE — Telephone Encounter (Signed)
Pt is calling in stating that a referral was sent to a Cardiologist and he needs for it to go to a Thoracic Surgeon Dr. Karie Georges [phone]).  Pt would like to have a call back once it has been sent.

## 2022-11-09 ENCOUNTER — Other Ambulatory Visit: Payer: Self-pay

## 2022-11-09 NOTE — Telephone Encounter (Signed)
Referral has been sent to Dr. Karie Georges patient called

## 2022-12-10 ENCOUNTER — Other Ambulatory Visit: Payer: Self-pay | Admitting: Neurology

## 2022-12-10 DIAGNOSIS — H532 Diplopia: Secondary | ICD-10-CM

## 2022-12-10 DIAGNOSIS — H02403 Unspecified ptosis of bilateral eyelids: Secondary | ICD-10-CM

## 2022-12-10 DIAGNOSIS — G7000 Myasthenia gravis without (acute) exacerbation: Secondary | ICD-10-CM

## 2022-12-14 ENCOUNTER — Encounter (INDEPENDENT_AMBULATORY_CARE_PROVIDER_SITE_OTHER): Payer: Self-pay

## 2023-01-02 ENCOUNTER — Ambulatory Visit: Payer: BC Managed Care – PPO | Admitting: Family Medicine

## 2023-01-02 VITALS — BP 136/84 | HR 83 | Temp 98.0°F | Ht 71.5 in | Wt 303.0 lb

## 2023-01-02 DIAGNOSIS — Z7984 Long term (current) use of oral hypoglycemic drugs: Secondary | ICD-10-CM | POA: Diagnosis not present

## 2023-01-02 DIAGNOSIS — G7000 Myasthenia gravis without (acute) exacerbation: Secondary | ICD-10-CM | POA: Diagnosis not present

## 2023-01-02 DIAGNOSIS — I10 Essential (primary) hypertension: Secondary | ICD-10-CM | POA: Diagnosis not present

## 2023-01-02 DIAGNOSIS — E119 Type 2 diabetes mellitus without complications: Secondary | ICD-10-CM | POA: Diagnosis not present

## 2023-01-02 LAB — HEMOGLOBIN A1C: Hgb A1c MFr Bld: 7.9 % — ABNORMAL HIGH (ref 4.6–6.5)

## 2023-01-02 LAB — GLUCOSE, RANDOM: Glucose, Bld: 152 mg/dL — ABNORMAL HIGH (ref 70–99)

## 2023-01-02 MED ORDER — METFORMIN HCL 500 MG PO TABS: 500.0000 mg | ORAL_TABLET | Freq: Two times a day (BID) | ORAL | 3 refills | Status: DC

## 2023-01-02 MED ORDER — AMLODIPINE BESYLATE 10 MG PO TABS
10.0000 mg | ORAL_TABLET | Freq: Every day | ORAL | 3 refills | Status: DC
Start: 2023-01-02 — End: 2023-12-26

## 2023-01-02 NOTE — Assessment & Plan Note (Signed)
Prednisone may be increasing blood sugars somewhat. I will continue metformin 500 mg daily. I will check his A1c today.

## 2023-01-02 NOTE — Addendum Note (Signed)
Addended by: Loyola Mast on: 01/02/2023 04:54 PM   Modules accepted: Orders

## 2023-01-02 NOTE — Progress Notes (Signed)
[facility] [address] [place] [address] Dept: [phone] Dept Fax: [phone]  Chronic Care Office Visit  Subjective:    Patient ID: Terrence Hoover, male    DOB: [birth year], 42 y.o..   MRN: [id number]  Chief Complaint  Patient presents with   Diabetes    F/u DM.  Fasting today.     History of Present Illness:  Patient is in today for reassessment of chronic medical issues.  Mr. Dilone was diagnosed with Type 2 diabetes in April. He is managed on metformin 500 mg daily. As he has been on prednisone, his blood sugars may be running higher.   Mr. Risenhoover has a history of hypertension. He is managed on amlodipine 5 mg daily.   Mr. Endrizzi has ocular myasthenia gravis, diagnosed in late March. He is engaged with neurology. He is being treated with pyridostigmine 60 mg daily and prednisone 10 mg daily. He has noted some eye symptoms returning with the lower dose of these medications. He will be seen tomorrow at [facility]. He is being prepared to undergo a thymectomy.  Past Medical History: Patient Active Problem List   Diagnosis Date Noted   Pulmonary nodule less than 6 mm determined by computed tomography of lung 10/05/2022   Ocular myasthenia gravis (HCC) 08/03/2022   Type 2 diabetes mellitus (HCC) 08/03/2022   Chronic right-sided low back pain with right-sided sciatica 07/16/2021   Androgen deficiency 02/07/2020   Vitamin D deficiency 02/07/2020   Circadian rhythm sleep disorder, shift work type 01/16/2019   Morbid obesity with body mass index (BMI) of 45.0 to 49.9 in adult (HCC) 01/16/2019   Nocturia more than twice per night 01/16/2019   Episodic cluster headache, not intractable 01/16/2019   Severe obstructive sleep apnea-hypopnea syndrome 01/16/2019   Insomnia 11/27/2018   Essential hypertension 12/01/2015   Past Surgical History:  Procedure Laterality Date   ACNE CYST REMOVAL     arround eye   SHOULDER  ARTHROSCOPY W/ ROTATOR CUFF REPAIR Right    Family History  Problem Relation Age of Onset   Hypertension Mother    Alcohol abuse Father    Hypertension Maternal Grandmother    Heart disease Maternal Grandfather    COPD Maternal Grandfather    Cancer Maternal Grandfather        Skin, lung   Hypertension Maternal Grandfather    Stroke Maternal Grandfather    Heart attack Maternal Grandfather    Outpatient Medications Prior to Visit  Medication Sig Dispense Refill   aspirin EC 325 MG tablet Take 325 mg by mouth as needed.     metFORMIN (GLUCOPHAGE) 500 MG tablet Take 1 tablet (500 mg total) by mouth daily with breakfast. 180 tablet 3   predniSONE (DELTASONE) 10 MG tablet Take 1 tablet (10 mg total) by mouth daily with breakfast. 30 tablet 2   pyridostigmine (MESTINON) 60 MG tablet Take 1 tablet (60 mg total) by mouth 3 (three) times daily. 90 tablet 5   amLODipine (NORVASC) 5 MG tablet TAKE 1 TABLET (5 MG TOTAL) BY MOUTH DAILY. 90 tablet 3   No facility-administered medications prior to visit.   Allergies  Allergen Reactions   Morphine Anaphylaxis and Shortness Of Breath    "stops breathing" "stops breathing"    Morphine And Codeine Anaphylaxis   Venomil Honey Bee Venom  [Honey Bee Venom] Anaphylaxis   Objective:   Today's Vitals   01/02/23 0821  BP: 136/84  Pulse: 83  Temp: 98 F (  36.7 C)  TempSrc: Temporal  SpO2: 97%  Weight: (!) 303 lb (137.4 kg)  Height: 5' 11.5" (1.816 m)   Body mass index is 41.67 kg/m.   General: Well developed, well nourished. No acute distress. Psych: Alert and oriented. Normal mood and affect.  Health Maintenance Due  Topic Date Due   OPHTHALMOLOGY EXAM  Never done   HIV Screening  Never done   Hepatitis C Screening  Never done     Assessment & Plan:   Problem List Items Addressed This Visit       Cardiovascular and Mediastinum   Essential hypertension    Blood pressure remains above goal. Prednisone likely increases this. I  will increase his amlodipine to 10 mg daily.      Relevant Medications   amLODipine (NORVASC) 10 MG tablet     Endocrine   Type 2 diabetes mellitus (HCC) - Primary    Prednisone may be increasing blood sugars somewhat. I will continue metformin 500 mg daily. I will check his A1c today.      Relevant Orders   Glucose, random   Hemoglobin A1c     Nervous and Auditory   Ocular myasthenia gravis (HCC)    Some recurrence of symptoms. Continue pyridostigmine 60 mg daily and prednisone 10 mg daily. Follow-up with [facility] related to planned thymectomy.       Return in about 3 months (around 04/04/2023) for Reassessment.   Loyola Mast, MD

## 2023-01-02 NOTE — Assessment & Plan Note (Signed)
Blood pressure remains above goal. Prednisone likely increases this. I will increase his amlodipine to 10 mg daily.

## 2023-01-02 NOTE — Assessment & Plan Note (Signed)
Some recurrence of symptoms. Continue pyridostigmine 60 mg daily and prednisone 10 mg daily. Follow-up with [facility] related to planned thymectomy.

## 2023-01-03 DIAGNOSIS — Z1383 Encounter for screening for respiratory disorder NEC: Secondary | ICD-10-CM | POA: Diagnosis not present

## 2023-01-03 DIAGNOSIS — G7000 Myasthenia gravis without (acute) exacerbation: Secondary | ICD-10-CM | POA: Diagnosis not present

## 2023-01-03 DIAGNOSIS — Z6841 Body Mass Index (BMI) 40.0 and over, adult: Secondary | ICD-10-CM | POA: Diagnosis not present

## 2023-01-03 DIAGNOSIS — F17290 Nicotine dependence, other tobacco product, uncomplicated: Secondary | ICD-10-CM | POA: Diagnosis not present

## 2023-01-20 NOTE — Progress Notes (Signed)
I saw Terrence Hoover in neurology clinic on 01/27/23 in follow up for AChR ab positive myasthenia gravis.  HPI: Terrence Hoover is a 42 y.o. year old male with a history of DM, OSA (does not have anymore per patient), androgen deficiency (previously treated with testosterone), vit D deficiency, HTN who we last saw on 10/21/22.  To briefly review: Patient is having difficulty opening his eyelids and diplopia. It started about 1.5 months ago. The diplopia goes away by covering one eye. Both symptoms fluctuate. He thinks symptoms are worse when driving. He thinks the mornings and evenings are okay. It gets worse as the day goes on. He first thought it was a stye in his eye. It did not get better, so he saw his PCP who felt this was ocular myasthenia gravis. He also saw ophtho who also felt it was OMG. AChR abs were sent and positive (blocking only sent, elevated to 21).   Current MG symptoms: Ptosis: yes, see above Double vision: yes, see above Speech: sometimes feels like he has slurred speech. If he stops and thinks, it improves. Chewing: No problems Swallowing: No problems Breathing: No problems including orthopnea Arm strength: No weakness Leg strength: No weakness   He does not report any constitutional symptoms like fever, night sweats, anorexia or unintentional weight loss.   EtOH use: None  Restrictive diet? No  Family history of neuropathy/myopathy/NM disease? No  10/21/22: CT chest showed no evidence of thymoma. AChR abs were positive.   Overall, patient is doing really well. He has only rare double vision.   Current MG symptoms: Ptosis: None Double vision: Rare Speech: None Chewing: None Swallowing: None Breathing: None Arm strength: None Leg strength: None    Current medications: -Prednisone 40 mg daily -Mestinon 60 mg TID   Side effects: none  Most recent Assessment and Plan (10/21/22): [name], a 42 y.o. male with AChR ab positive myasthenia  gravis. He has at least ocular myasthenia gravis and may have generalized given the slurred speech present at symptom onset. CT chest showed no thymoma. After discussion, patient is interested in thymectomy. I will send this referral. Fortunately, he has responded very well to steroids and mestinon and appears symptom free today.   Plan: -Reduced prednisone to 20 mg daily starting tomorrow. Take for 1 month then decrease to 10 mg daily (11/20/22) and continue this until follow up. -Continue mestinon 60 mg TID -Lung nodule being followed by PCP - follow up scan in 2 years -Patient interested in thymectomy - will send referral today to cardiothoracic surgery to discuss  Since their last visit: Patient is doing well. He saw surgery and is getting thymectomy on 03/10/23 at [facility].  Current MG symptoms: Ptosis: None Double vision: has blurry or double vision less than once every 2 weeks Speech: None Chewing: None Swallowing: None Breathing: None Arm strength: None Leg strength: None  Current medications:  -Prednisone 10 mg daily -Mestinon 60 mg TID - takes it once a day in the morning only; occasional will take a second dose  Side effects: None    MEDICATIONS:  Outpatient Encounter Medications as of 01/27/2023  Medication Sig   amLODipine (NORVASC) 10 MG tablet Take 1 tablet (10 mg total) by mouth daily.   metFORMIN (GLUCOPHAGE) 500 MG tablet Take 1 tablet (500 mg total) by mouth 2 (two) times daily with a meal.   predniSONE (DELTASONE) 10 MG tablet Take 1 tablet (10 mg total) by mouth daily with breakfast.  pyridostigmine (MESTINON) 60 MG tablet Take 1 tablet (60 mg total) by mouth 3 (three) times daily. (Patient taking differently: Take 60 mg by mouth once.)   aspirin EC 325 MG tablet Take 325 mg by mouth as needed. (Patient not taking: Reported on 01/27/2023)   No facility-administered encounter medications on file as of 01/27/2023.    PAST MEDICAL HISTORY: Past Medical History:   Diagnosis Date   Androgen deficiency    Diabetes mellitus without complication (HCC)    Hypertension    OSA (obstructive sleep apnea)    Vitamin D deficiency     PAST SURGICAL HISTORY: Past Surgical History:  Procedure Laterality Date   ACNE CYST REMOVAL     arround eye   SHOULDER ARTHROSCOPY W/ ROTATOR CUFF REPAIR Right     ALLERGIES: Allergies  Allergen Reactions   Morphine Anaphylaxis and Shortness Of Breath    "stops breathing" "stops breathing"    Morphine And Codeine Anaphylaxis   Venomil Honey Bee Venom  [Honey Bee Venom] Anaphylaxis    FAMILY HISTORY: Family History  Problem Relation Age of Onset   Hypertension Mother    Alcohol abuse Father    Hypertension Maternal Grandmother    Heart disease Maternal Grandfather    COPD Maternal Grandfather    Cancer Maternal Grandfather        Skin, lung   Hypertension Maternal Grandfather    Stroke Maternal Grandfather    Heart attack Maternal Grandfather     SOCIAL HISTORY: Social History   Tobacco Use   Smoking status: Never   Smokeless tobacco: Never  Vaping Use   Vaping status: Never Used  Substance Use Topics   Alcohol use: No   Drug use: Never   Social History   Social History Narrative   Are you right handed or left handed? Left   Are you currently employed ?    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 no   Who lives with you? Wife and 3 kids   What type of home do you live in: 1 story or 2 story? One story    Caffeine occasionally    Objective:  Vital Signs:  BP 133/85   Pulse 100   Ht 5' 11.5" (1.816 m)   Wt (!) 304 lb (137.9 kg)   SpO2 100%   BMI 41.81 kg/m   General: No acute distress.  Patient appears well-groomed.   Head:  Normocephalic/atraumatic Neck: supple Lungs: Non-labored breathing on room air   Neurological Exam: Mental status: alert and oriented, speech fluent and not dysarthric, language intact.  Cranial nerves: CN I: not  tested CN II: pupils equal, round and reactive to light, visual fields intact CN III, IV, VI:  full range of motion, no nystagmus, no ptosis including up sustained up gaze. CN V: facial sensation intact. CN VII: upper and lower face symmetric. Orbicularis oris and orbicularis oculi strong bilaterally. CN VIII: hearing intact CN IX, X: uvula midline CN XI: sternocleidomastoid and trapezius muscles intact CN XII: tongue midline  Bulk & Tone: normal, no fasciculations. Motor:  muscle strength 5/5 throughout Deep Tendon Reflexes:  2+ throughout.   Sensation:  Light touch sensation intact. Finger to nose testing:  Without dysmetria.    Gait:  Normal station and stride.  Romberg negative.   Lab and Test Review: New results: HbA1c (01/02/23): 7.9  Previously reviewed results: AChR abs (09/14/22): positive: binding elevated to 5.28, blocking elevated to 40, modulating elevated  to 71  08/03/22: TSH: 2.00 AChR blocking antibodies: elevated to 21 CMP significant for elevated glucose CBC wnl   HbA1c (09/07/22): 7.0   Vit D (02/06/2020): 10.13   CT chest w/wo contrast (09/28/22): FINDINGS: Cardiovascular: The heart is normal in size and there is no pericardial effusion. The aorta and pulmonary trunk are normal in caliber.   Mediastinum/Nodes: No mediastinal, hilar, or axillary lymphadenopathy. The thyroid gland, trachea, and esophagus are within normal limits. There is no evidence of thymic nodule or mass or hyperplasia.   Lungs/Pleura: 3 mm nodule is present in the right middle lobe, axial image 78. No effusion or pneumothorax.   Upper Abdomen: There is fatty infiltration of the liver. No acute abnormality.   Musculoskeletal: Degenerative changes are present in the thoracic spine. No acute or suspicious osseous abnormality is seen.   IMPRESSION: 1. No evidence of thymic nodule, mass, or hyperplasia. 2. 3 mm right middle lobe pulmonary nodule. Follow-up by chest CT without  contrast is recommended in 2 years, and again at 4 years to confirm stability. This recommendation follows the consensus statement: Recommendations for the Management of Subsolid Pulmonary Nodules Detected at CT: A Statement from the Fleischner Society as published in Radiology 2013; 266:304-317. 3. Hepatic steatosis.   EMG (10/21/19 by Dr. 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  The EMG evaluation of the right upper extremity is unremarkable, no evidence of an overlying cervical radiculopathy is seen.  Clinical evaluation this patient suggests a possible right lateral epicondylitis.  ASSESSMENT: [name], a 42 y.o. male with AChR ab positive myasthenia gravis. He has at least ocular myasthenia gravis and may have generalized given the slurred speech present at symptom onset. CT chest showed no thymoma. After discussion, patient is interested in thymectomy. Surgery is planned for 03/10/23. Fortunately, he has responded very well to steroids and mestinon and MG appears well controlled with minimal manifestations.   Plan: -Prednisone 7.5 mg daily -Mestinon 60 mg TID PRN -Thymectomy as planned on 03/10/23 at [facility]  Return to clinic in 4 months  Jacquelyne Balint, MD

## 2023-01-26 ENCOUNTER — Other Ambulatory Visit: Payer: Self-pay | Admitting: Neurology

## 2023-01-26 DIAGNOSIS — G7000 Myasthenia gravis without (acute) exacerbation: Secondary | ICD-10-CM

## 2023-01-27 ENCOUNTER — Encounter: Payer: Self-pay | Admitting: Neurology

## 2023-01-27 ENCOUNTER — Ambulatory Visit: Payer: BC Managed Care – PPO | Admitting: Neurology

## 2023-01-27 VITALS — BP 133/85 | HR 100 | Ht 71.5 in | Wt 304.0 lb

## 2023-01-27 DIAGNOSIS — H532 Diplopia: Secondary | ICD-10-CM | POA: Diagnosis not present

## 2023-01-27 DIAGNOSIS — G7000 Myasthenia gravis without (acute) exacerbation: Secondary | ICD-10-CM

## 2023-01-27 DIAGNOSIS — R471 Dysarthria and anarthria: Secondary | ICD-10-CM | POA: Diagnosis not present

## 2023-01-27 DIAGNOSIS — H02403 Unspecified ptosis of bilateral eyelids: Secondary | ICD-10-CM

## 2023-01-27 MED ORDER — PREDNISONE 2.5 MG PO TABS
7.5000 mg | ORAL_TABLET | Freq: Every day | ORAL | 6 refills | Status: DC
Start: 2023-01-27 — End: 2023-05-31

## 2023-01-27 NOTE — Patient Instructions (Signed)
-  Reduced Prednisone 7.5 mg daily (it will be 3 pills of 2.5 mg). I sent a new prescription to your pharmacy. -Mestinon 60 mg up to 3 times daily as needed -Thymectomy as planned on 03/10/23 at [facility]  Return to clinic in 4 months  The physicians and staff at [facility] are committed to providing excellent care. You may receive a survey requesting feedback about your experience at our office. We strive to receive "very good" responses to the survey questions. If you feel that your experience would prevent you from giving the office a "very good " response, please contact our office to try to remedy the situation. We may be reached at [phone]. Thank you for taking the time out of your busy day to complete the survey.  Jacquelyne Balint, MD [facility]

## 2023-03-07 DIAGNOSIS — Z7984 Long term (current) use of oral hypoglycemic drugs: Secondary | ICD-10-CM | POA: Diagnosis not present

## 2023-03-07 DIAGNOSIS — Z91030 Bee allergy status: Secondary | ICD-10-CM | POA: Diagnosis not present

## 2023-03-07 DIAGNOSIS — Z01818 Encounter for other preprocedural examination: Secondary | ICD-10-CM | POA: Diagnosis not present

## 2023-03-07 DIAGNOSIS — I10 Essential (primary) hypertension: Secondary | ICD-10-CM | POA: Diagnosis not present

## 2023-03-07 DIAGNOSIS — E119 Type 2 diabetes mellitus without complications: Secondary | ICD-10-CM | POA: Diagnosis not present

## 2023-03-07 DIAGNOSIS — J90 Pleural effusion, not elsewhere classified: Secondary | ICD-10-CM | POA: Diagnosis not present

## 2023-03-07 DIAGNOSIS — Z79899 Other long term (current) drug therapy: Secondary | ICD-10-CM | POA: Diagnosis not present

## 2023-03-07 DIAGNOSIS — Z885 Allergy status to narcotic agent status: Secondary | ICD-10-CM | POA: Diagnosis not present

## 2023-03-07 DIAGNOSIS — G7000 Myasthenia gravis without (acute) exacerbation: Secondary | ICD-10-CM | POA: Diagnosis not present

## 2023-03-07 DIAGNOSIS — Z4682 Encounter for fitting and adjustment of non-vascular catheter: Secondary | ICD-10-CM | POA: Diagnosis not present

## 2023-03-10 DIAGNOSIS — Z9089 Acquired absence of other organs: Secondary | ICD-10-CM | POA: Insufficient documentation

## 2023-03-13 ENCOUNTER — Other Ambulatory Visit: Payer: Self-pay | Admitting: Neurology

## 2023-03-13 DIAGNOSIS — H532 Diplopia: Secondary | ICD-10-CM

## 2023-03-13 DIAGNOSIS — G7000 Myasthenia gravis without (acute) exacerbation: Secondary | ICD-10-CM

## 2023-03-13 DIAGNOSIS — H02403 Unspecified ptosis of bilateral eyelids: Secondary | ICD-10-CM

## 2023-03-20 ENCOUNTER — Ambulatory Visit: Payer: BC Managed Care – PPO | Admitting: Family Medicine

## 2023-04-11 ENCOUNTER — Encounter: Payer: Self-pay | Admitting: Family Medicine
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
## 2023-05-31 ENCOUNTER — Encounter: Payer: Self-pay | Admitting: Neurology

## 2023-05-31 ENCOUNTER — Ambulatory Visit: Payer: BC Managed Care – PPO | Admitting: Neurology

## 2023-05-31 VITALS — BP 134/72 | HR 88 | Ht 71.5 in | Wt 310.0 lb

## 2023-05-31 DIAGNOSIS — R471 Dysarthria and anarthria: Secondary | ICD-10-CM | POA: Diagnosis not present

## 2023-05-31 DIAGNOSIS — G7000 Myasthenia gravis without (acute) exacerbation: Secondary | ICD-10-CM | POA: Diagnosis not present

## 2023-05-31 DIAGNOSIS — H02403 Unspecified ptosis of bilateral eyelids: Secondary | ICD-10-CM | POA: Diagnosis not present

## 2023-05-31 DIAGNOSIS — H532 Diplopia: Secondary | ICD-10-CM

## 2023-05-31 DIAGNOSIS — R413 Other amnesia: Secondary | ICD-10-CM

## 2023-05-31 DIAGNOSIS — G4730 Sleep apnea, unspecified: Secondary | ICD-10-CM

## 2023-05-31 DIAGNOSIS — Z72820 Sleep deprivation: Secondary | ICD-10-CM

## 2023-05-31 MED ORDER — PREDNISONE 5 MG PO TABS: 5.0000 mg | ORAL_TABLET | Freq: Every day | ORAL | 11 refills | Status: DC

## 2023-05-31 NOTE — Patient Instructions (Signed)
 We will reduce your prednisone  to 5 mg daily. I am sending a new prescription to your pharmacy.  Continue Mestinon  60 mg TID as needed.  Go to nearest emergency room if you have severe weakness, difficulty breathing or swallowing as this could be a myasthenic crisis (flare).  I will see you again in 6 months. If your symptoms significantly change, please give me a call sooner though.  I am referring you to sleep medicine to discuss your poor sleep. This may be due to sleep apnea or something else. In any case, poor sleep can affect memory and energy levels.  The physicians and staff at [facility] are committed to providing excellent care. You may receive a survey requesting feedback about your experience at our office. We strive to receive "very good" responses to the survey questions. If you feel that your experience would prevent you from giving the office a "very good " response, please contact our office to try to remedy the situation. We may be reached at [phone]. Thank you for taking the time out of your busy day to complete the survey.  Rommie Coats, MD [facility]

## 2023-06-14 ENCOUNTER — Other Ambulatory Visit: Payer: Self-pay | Admitting: Neurology

## 2023-06-14 DIAGNOSIS — H532 Diplopia: Secondary | ICD-10-CM

## 2023-06-14 DIAGNOSIS — G7000 Myasthenia gravis without (acute) exacerbation: Secondary | ICD-10-CM

## 2023-06-14 DIAGNOSIS — H02403 Unspecified ptosis of bilateral eyelids: Secondary | ICD-10-CM

## 2023-09-16 IMAGING — CR DG LUMBAR SPINE COMPLETE 4+V
5 series · 5 of 5 positions shown · non-contrast
Comparison: None.

CLINICAL DATA: Pain

EXAM:
PELVIS - 1 VIEW; CERVICAL SPINE - COMPLETE 4 VIEW; LUMBAR SPINE -
COMPLETE 4 VIEW; THORACIC SPINE - 3 VIEWS

[w lumbar spine ap]
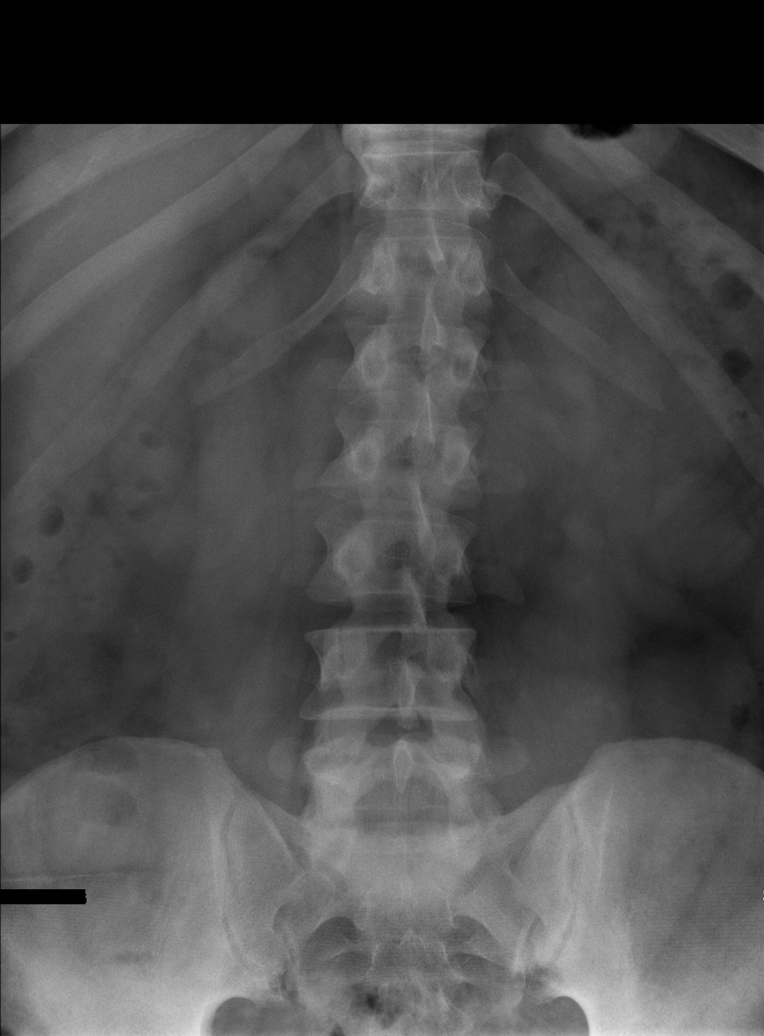

[w lumbar spine obl (1 of 2)]
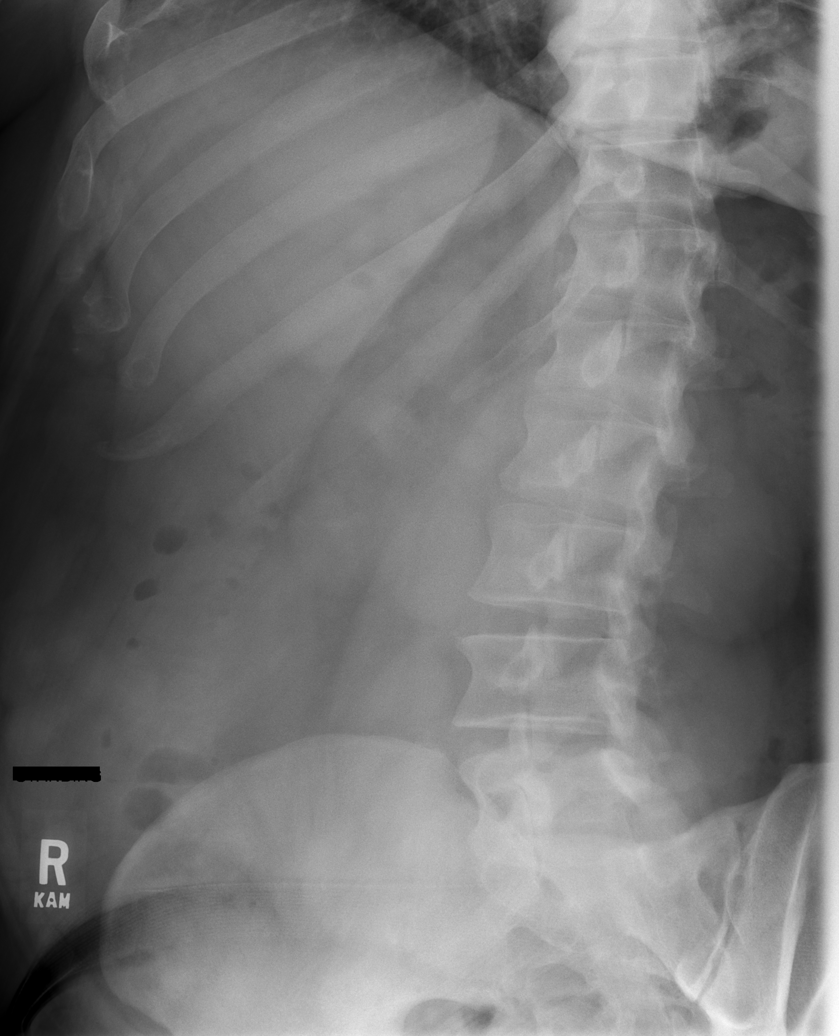

[w lumbar spine obl (2 of 2)]
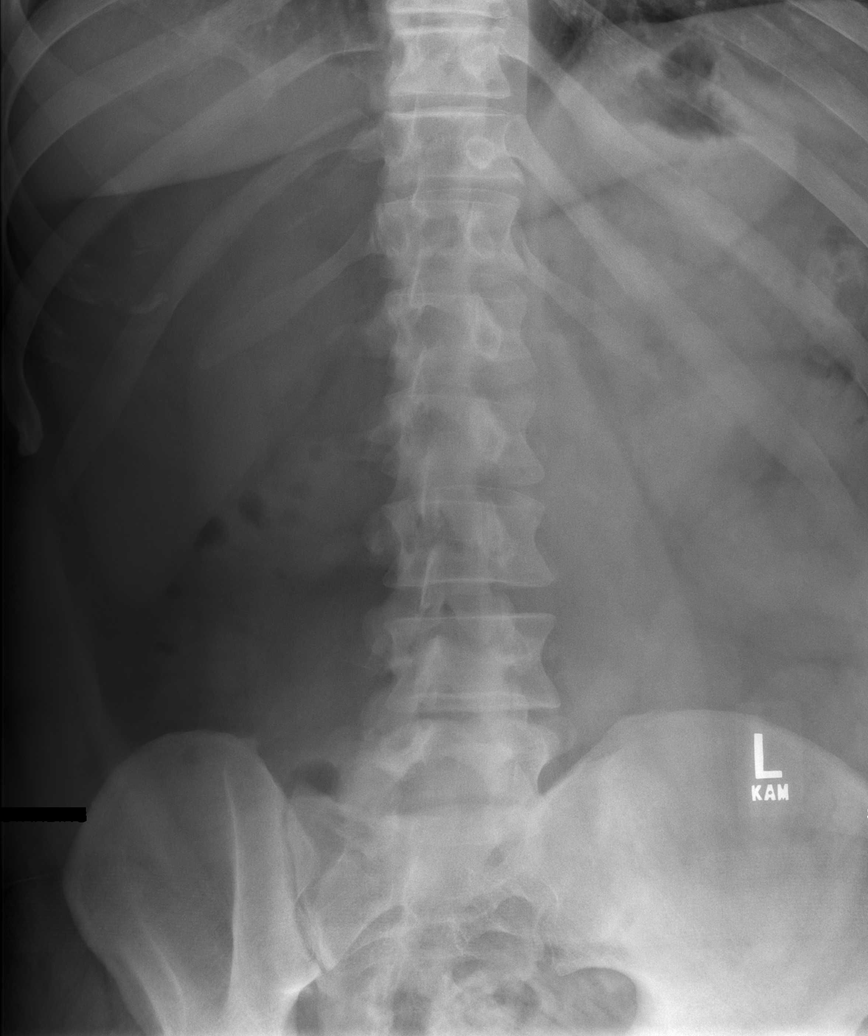

[w lumbar spine lat]
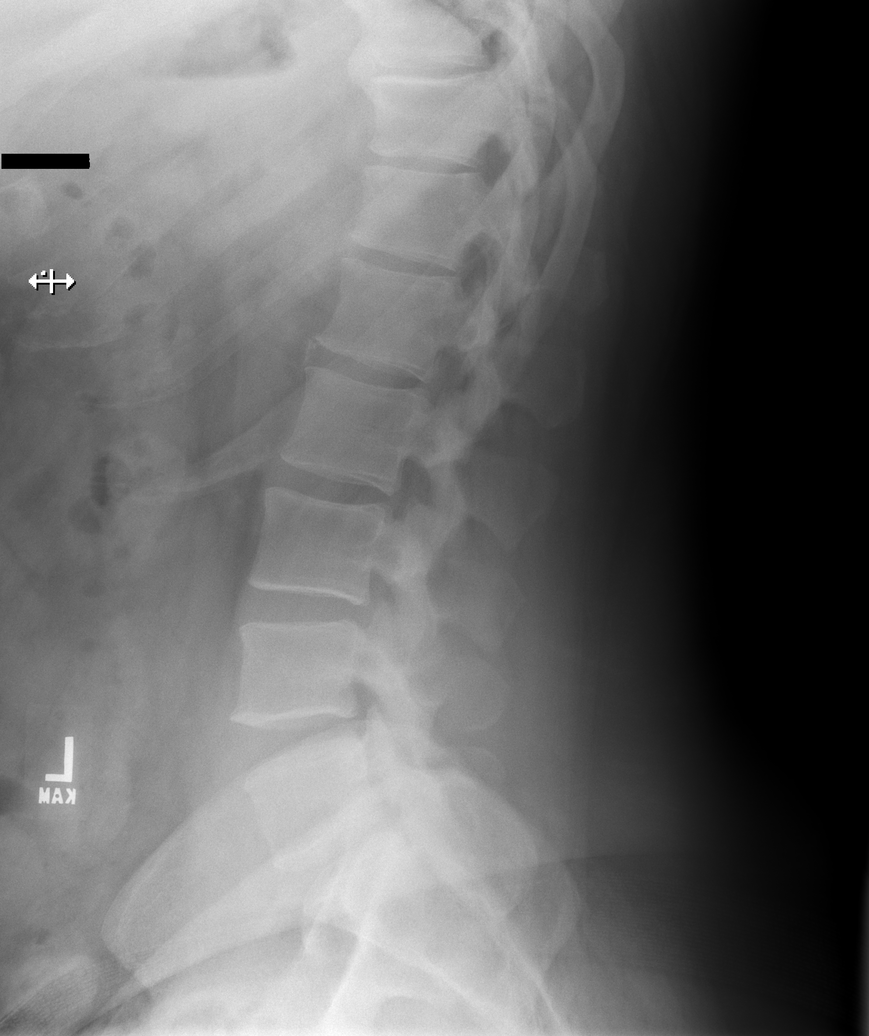

[w lumbar l-5 s-1 spot]
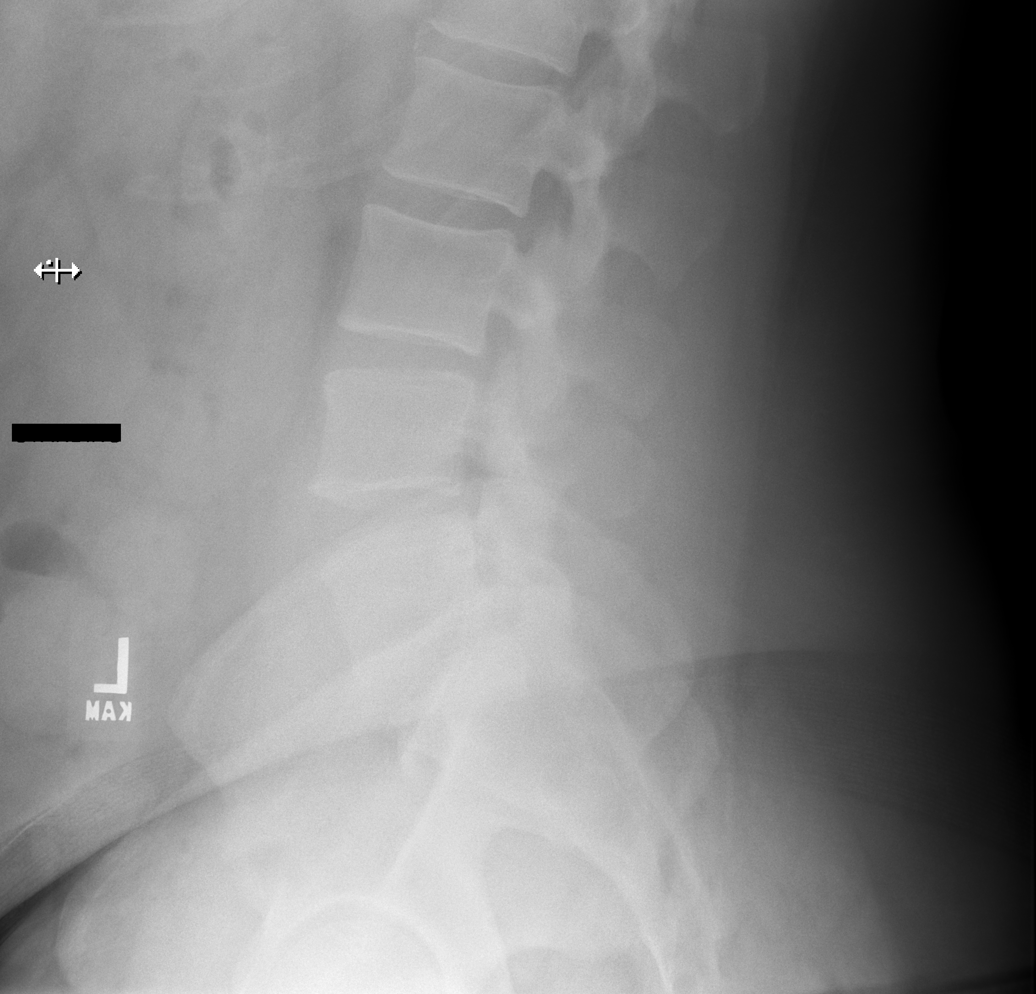

[5 of 5 positions shown; findings below may reference images not displayed]

FINDINGS: Cervical spine: No evidence of cervical spine fracture or
prevertebral soft tissue swelling. Normal alignment. No significant
degenerative changes. Dens is intact on open-mouth view. Soft
tissues are unremarkable.

Thoracic spine: No evidence of thoracic spine fracture. No
malalignment. Mild multilevel degenerative disc disease of the
thoracic spine. Soft tissues are unremarkable.

Lumbar spine: No evidence of lumbar spine fracture. Normal
alignment. Minimal osteophyte formation of the lumbar spine with
well-maintained disc spaces. Soft tissues are unremarkable

Pelvis: No evidence of pelvic fracture or diastasis. Joint spaces
well-maintained. No pelvic bone lesions are seen.
IMPRESSION: No acute osseous abnormality.

## 2023-10-18 DIAGNOSIS — G7000 Myasthenia gravis without (acute) exacerbation: Secondary | ICD-10-CM | POA: Diagnosis not present

## 2023-10-19 ENCOUNTER — Telehealth: Payer: Self-pay | Admitting: Neurology

## 2023-10-19 NOTE — Telephone Encounter (Signed)
 Patient having more ptosis. This started about 1 week ago. He increased the prednisone  to 10 mg 2 days ago and also restarted taking mestinon  60 mg TID.  He thinks this has helped some.  He has no difficulty swallowing or breathing or other weakness.  We discussed signs of crisis and reasons to go to ED (severe weakness, difficulty breathing, difficulty swallowing). He will continue on prednisone  10 mg daily and mestinon  60 mg TID for now. He will call if symptoms worsen.  He is scheduled to see me next month, but will call if he needs to be seen sooner.  All questions were answered.  Rommie Coats, MD [facility]

## 2023-10-19 NOTE — Telephone Encounter (Signed)
 Pt called in stating he has been having symptoms of his myasthenia gravis again. They are just in his face like last time. He is taking 2 of his prednisone  pills instead of 1 and it's not fixing it.

## 2023-10-25 ENCOUNTER — Other Ambulatory Visit: Payer: Self-pay

## 2023-10-25 MED ORDER — PREDNISONE 10 MG PO TABS: 10.0000 mg | ORAL_TABLET | Freq: Every day | ORAL | 1 refills | Status: DC

## 2023-10-25 NOTE — Telephone Encounter (Signed)
 Pt's wife called in stating the pharmacy won't let them fill the prednisone  prescription. The pharmacy told her we needed to send the updated prescription of 10 mg so they will fill it. Pharmacy is [facility] in [place]

## 2023-10-25 NOTE — Telephone Encounter (Signed)
 Called pt and let him know that I sent the new script to DR. Hill due to him increasing Prednisone  to 10mg . Waiting on him to sign

## 2023-11-06 ENCOUNTER — Encounter: Admitting: Family Medicine
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
## 2023-11-29 ENCOUNTER — Telehealth: Payer: Self-pay | Admitting: Family Medicine

## 2023-11-29 ENCOUNTER — Ambulatory Visit: Payer: BC Managed Care – PPO | Admitting: Neurology

## 2023-11-29 NOTE — Telephone Encounter (Signed)
 03/20/2023 no show 11/06/2023 no show  Final warning sent via mail and mychart

## 2023-12-23 ENCOUNTER — Other Ambulatory Visit: Payer: Self-pay | Admitting: Neurology

## 2023-12-25 NOTE — Telephone Encounter (Signed)
 Will you please schedule an appointment for this patient?

## 2023-12-25 NOTE — Telephone Encounter (Signed)
 Please advise. Last OV 05/31/23.

## 2023-12-26 ENCOUNTER — Other Ambulatory Visit: Payer: Self-pay | Admitting: Family Medicine

## 2023-12-26 DIAGNOSIS — I10 Essential (primary) hypertension: Secondary | ICD-10-CM

## 2023-12-26 NOTE — Telephone Encounter (Signed)
 Left VM to rtn call to schedule a follow up appt. Dm/cma
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
## 2024-01-03 NOTE — Telephone Encounter (Signed)
 Patient schedule for 01/10/24. Dm/cma

## 2024-01-10 ENCOUNTER — Encounter: Payer: Self-pay | Admitting: Neurology

## 2024-01-10 ENCOUNTER — Ambulatory Visit: Admitting: Family Medicine

## 2024-01-10 ENCOUNTER — Encounter: Payer: Self-pay | Admitting: Family Medicine

## 2024-01-10 ENCOUNTER — Ambulatory Visit: Admitting: Neurology

## 2024-01-10 VITALS — BP 137/99 | HR 84 | Ht 71.5 in | Wt 292.0 lb

## 2024-01-10 VITALS — BP 126/82 | HR 92 | Temp 97.8°F | Ht 71.0 in | Wt 292.8 lb

## 2024-01-10 DIAGNOSIS — Z7985 Long-term (current) use of injectable non-insulin antidiabetic drugs: Secondary | ICD-10-CM

## 2024-01-10 DIAGNOSIS — I10 Essential (primary) hypertension: Secondary | ICD-10-CM

## 2024-01-10 DIAGNOSIS — E291 Testicular hypofunction: Secondary | ICD-10-CM

## 2024-01-10 DIAGNOSIS — Z72820 Sleep deprivation: Secondary | ICD-10-CM

## 2024-01-10 DIAGNOSIS — Z7984 Long term (current) use of oral hypoglycemic drugs: Secondary | ICD-10-CM

## 2024-01-10 DIAGNOSIS — G7000 Myasthenia gravis without (acute) exacerbation: Secondary | ICD-10-CM

## 2024-01-10 DIAGNOSIS — E119 Type 2 diabetes mellitus without complications: Secondary | ICD-10-CM

## 2024-01-10 MED ORDER — PREDNISONE 5 MG PO TABS: 5.0000 mg | ORAL_TABLET | Freq: Every day | ORAL | 3 refills | Status: AC

## 2024-01-10 MED ORDER — METFORMIN HCL 500 MG PO TABS: 500.0000 mg | ORAL_TABLET | Freq: Two times a day (BID) | ORAL | 3 refills | Status: DC

## 2024-01-10 MED ORDER — OZEMPIC (0.25 OR 0.5 MG/DOSE) 2 MG/3ML ~~LOC~~ SOPN: PEN_INJECTOR | SUBCUTANEOUS | 1 refills | Status: DC

## 2024-01-10 MED ORDER — AMLODIPINE BESYLATE 10 MG PO TABS: 10.0000 mg | ORAL_TABLET | Freq: Every day | ORAL | 3 refills | Status: AC

## 2024-01-10 NOTE — Patient Instructions (Signed)
-  Prednisone  5 mg daily -Mestinon  60 mg TID PRN  For bone health while on steroids: -Vit D 1000 international units daily -Calcium  1000-1200 mg daily (diet or supplemental)  Return to clinic in 6 months  Go to nearest emergency room if you have severe weakness, difficulty breathing or swallowing as this could be a myasthenic crisis (flare).  The physicians and staff at [facility] are committed to providing excellent care. You may receive a survey requesting feedback about your experience at our office. We strive to receive very good responses to the survey questions. If you feel that your experience would prevent you from giving the office a very good  response, please contact our office to try to remedy the situation. We may be reached at [phone]. Thank you for taking the time out of your busy day to complete the survey.  Venetia Potters, MD [facility]

## 2024-01-10 NOTE — Assessment & Plan Note (Signed)
 Reviewing prior records, I find only a single testosterone  which was low. Another value was in a normal range. I will have him return for a morning testosterone  level. We discussed that he should have two values documented as low to confirm hypogonadism.

## 2024-01-10 NOTE — Assessment & Plan Note (Signed)
 Prednisone  may be increasing blood sugars somewhat. Terrence Hoover is having some GI side effects from his metformin . I will renew his metformin  500 mg bid for now. I will try prescribing semaglutide  (Ozempic ) 0.25 mg weekly, increasing after 1 month to 0.5 mg weekly. If he is able to get started on this medicine, he can stop his metformin . I will check annual renal labs today.

## 2024-01-10 NOTE — Assessment & Plan Note (Signed)
 Blood pressure is in adequate control. Continue amlodipine  10 mg daily. I will check annual renal labs.

## 2024-01-10 NOTE — Progress Notes (Signed)
 [facility] [address] [place] [address] Dept: [phone] Dept Fax: [phone]  Chronic Care Office Visit  Subjective:    Patient ID: Terrence Hoover, male    DOB: 1980-11-08, 43 y.o..   MRN: [id number]  Chief Complaint  Patient presents with   Diabetes    F/u DM/HTN.  Average BS 110-224 & BP 138/80 at home.     History of Present Illness:  Patient is in today for reassessment of chronic medical issues.  Mr. Okuda was diagnosed with Type 2 diabetes in April 2024. He is managed on metformin  500 mg bid. He notes that he often feels GI upset throughout the day, with some nausea. He would prefer to switch off of metformin  at this point.   Mr. Deisher has a history of hypertension. He is managed on amlodipine  10 mg daily.   Mr. Grassia has ocular myasthenia gravis, diagnosed in late March. He is engaged with neurology. He may be showing some signs of more generalized myasthenia as he is having some slurred speech at times. He is being treated with pyridostigmine  60 mg daily (reduced from TID) and prednisone  5 mg daily. In the past year, he underwent a thymectomy. He notes the plan is a very slow taper of his prednisone . He was advised to take a daily Vitamin D  and calcium  supplement to prevent against osteoporosis development.  Mr. Parker has a past history of androgen deficiency. He notes he was prescribed injections for this previously. He does note lack of energy that he associates with this.  Past Medical History: Patient Active Problem List   Diagnosis Date Noted   S/P thymectomy 03/10/2023   Pulmonary nodule less than 6 mm determined by computed tomography of lung 10/05/2022   Ocular myasthenia gravis (HCC) 08/03/2022   Type 2 diabetes mellitus (HCC) 08/03/2022   Chronic right-sided low back pain with right-sided sciatica 07/16/2021   Androgen deficiency 02/07/2020   Vitamin D  deficiency 02/07/2020    Circadian rhythm sleep disorder, shift work type 01/16/2019   Morbid obesity with body mass index (BMI) of 45.0 to 49.9 in adult (HCC) 01/16/2019   Nocturia more than twice per night 01/16/2019   Episodic cluster headache, not intractable 01/16/2019   Severe obstructive sleep apnea-hypopnea syndrome 01/16/2019   Insomnia 11/27/2018   Essential hypertension 12/01/2015   Past Surgical History:  Procedure Laterality Date   ACNE CYST REMOVAL     arround eye   SHOULDER ARTHROSCOPY W/ ROTATOR CUFF REPAIR Right    Family History  Problem Relation Age of Onset   Hypertension Mother    Alcohol abuse Father    Hypertension Maternal Grandmother    Heart disease Maternal Grandfather    COPD Maternal Grandfather    Cancer Maternal Grandfather        Skin, lung   Hypertension Maternal Grandfather    Stroke Maternal Grandfather    Heart attack Maternal Grandfather    Outpatient Medications Prior to Visit  Medication Sig Dispense Refill   predniSONE  (DELTASONE ) 5 MG tablet Take 1 tablet (5 mg total) by mouth daily with breakfast. 90 tablet 3   pyridostigmine  (MESTINON ) 60 MG tablet TAKE 1 TABLET BY MOUTH 3 TIMES DAILY. 90 tablet 5   amLODipine  (NORVASC ) 10 MG tablet TAKE 1 TABLET BY MOUTH EVERY DAY 30 tablet 0   metFORMIN  (GLUCOPHAGE ) 500 MG tablet Take 1 tablet (500 mg total) by mouth 2 (two) times daily with a meal. 180 tablet 3  No facility-administered medications prior to visit.   Allergies  Allergen Reactions   Morphine Anaphylaxis and Shortness Of Breath    stops breathing stops breathing    Morphine And Codeine Anaphylaxis   Venomil Honey Bee Venom  [Honey Bee Venom] Anaphylaxis   Objective:   Today's Vitals   01/10/24 1422  BP: 126/82  Pulse: 92  Temp: 97.8 F (36.6 C)  TempSrc: Temporal  SpO2: 98%  Weight: 292 lb 12.8 oz (132.8 kg)  Height: 5' 11 (1.803 m)   Body mass index is 40.84 kg/m.   General: Well developed, well nourished. No acute  distress. Psych: Alert and oriented. Normal mood and affect.  Health Maintenance Due  Topic Date Due   OPHTHALMOLOGY EXAM  Never done   HIV Screening  Never done   Diabetic kidney evaluation - Urine ACR  Never done   Hepatitis C Screening  Never done   Pneumococcal Vaccine (1 of 2 - PCV) Never done   Hepatitis B Vaccines 19-59 Average Risk (1 of 3 - 19+ 3-dose series) Never done   HPV VACCINES (1 - Risk 3-dose SCDM series) Never done   HEMOGLOBIN A1C  07/05/2023   Diabetic kidney evaluation - eGFR measurement  08/03/2023   FOOT EXAM  10/05/2023     Assessment & Plan:   Problem List Items Addressed This Visit       Cardiovascular and Mediastinum   Essential hypertension   Blood pressure is in adequate control. Continue amlodipine  10 mg daily. I will check annual renal labs.      Relevant Medications   amLODipine  (NORVASC ) 10 MG tablet     Endocrine   Androgen deficiency - Primary   Reviewing prior records, I find only a single testosterone  which was low. Another value was in a normal range. I will have him return for a morning testosterone  level. We discussed that he should have two values documented as low to confirm hypogonadism.      Relevant Orders   Testosterone    Type 2 diabetes mellitus (HCC)   Prednisone  may be increasing blood sugars somewhat. Mr. Piercefield is having some GI side effects from his metformin . I will renew his metformin  500 mg bid for now. I will try prescribing semaglutide  (Ozempic ) 0.25 mg weekly, increasing after 1 month to 0.5 mg weekly. If he is able to get started on this medicine, he can stop his metformin . I will check annual renal labs today.      Relevant Medications   metFORMIN  (GLUCOPHAGE ) 500 MG tablet   Semaglutide ,0.25 or 0.5MG /DOS, (OZEMPIC , 0.25 OR 0.5 MG/DOSE,) 2 MG/3ML SOPN   Other Relevant Orders   Lipid panel   Microalbumin / creatinine urine ratio   Basic metabolic panel with GFR   Hemoglobin A1c   Urinalysis, Routine w reflex  microscopic    Return in about 2 months (around 03/11/2024) for Reassessment.   Garnette CHRISTELLA Simpler, MD

## 2024-01-11 ENCOUNTER — Ambulatory Visit: Payer: Self-pay | Admitting: Family Medicine

## 2024-01-11 ENCOUNTER — Other Ambulatory Visit (INDEPENDENT_AMBULATORY_CARE_PROVIDER_SITE_OTHER)

## 2024-01-11 DIAGNOSIS — E291 Testicular hypofunction: Secondary | ICD-10-CM

## 2024-01-11 DIAGNOSIS — R809 Proteinuria, unspecified: Secondary | ICD-10-CM | POA: Insufficient documentation

## 2024-01-11 DIAGNOSIS — E785 Hyperlipidemia, unspecified: Secondary | ICD-10-CM | POA: Insufficient documentation

## 2024-01-11 DIAGNOSIS — E119 Type 2 diabetes mellitus without complications: Secondary | ICD-10-CM

## 2024-01-11 LAB — LIPID PANEL
Cholesterol: 198 mg/dL (ref 0–200)
HDL: 37.6 mg/dL — ABNORMAL LOW (ref >39.00)
LDL Cholesterol: 117 mg/dL — ABNORMAL HIGH (ref 0–99)
NonHDL: 159.97
Total CHOL/HDL Ratio: 5
Triglycerides: 214 mg/dL — ABNORMAL HIGH (ref 0.0–149.0)
VLDL: 42.8 mg/dL — ABNORMAL HIGH (ref 0.0–40.0)

## 2024-01-11 LAB — URINALYSIS, ROUTINE W REFLEX MICROSCOPIC
Bilirubin Urine: NEGATIVE
Hgb urine dipstick: NEGATIVE
Ketones, ur: NEGATIVE
Leukocytes,Ua: NEGATIVE
Nitrite: NEGATIVE
RBC / HPF: NONE SEEN (ref 0–?)
Specific Gravity, Urine: >=1.03 — AB (ref 1.000–1.030)
Total Protein, Urine: 30 — AB
Urine Glucose: >=1000 — AB
Urobilinogen, UA: 1 (ref 0.0–1.0)
pH: 5.5 (ref 5.0–8.0)

## 2024-01-11 LAB — HEMOGLOBIN A1C: Hgb A1c MFr Bld: 11 % — ABNORMAL HIGH (ref 4.6–6.5)

## 2024-01-11 LAB — BASIC METABOLIC PANEL WITH GFR
BUN: 8 mg/dL (ref 6–23)
CO2: 23 meq/L (ref 19–32)
Calcium: 8.7 mg/dL (ref 8.4–10.5)
Chloride: 104 meq/L (ref 96–112)
Creatinine, Ser: 0.96 mg/dL (ref 0.40–1.50)
GFR: 96.21 mL/min (ref >60.00)
Glucose, Bld: 243 mg/dL — ABNORMAL HIGH (ref 70–99)
Potassium: 3.8 meq/L (ref 3.5–5.1)
Sodium: 139 meq/L (ref 135–145)

## 2024-01-11 LAB — TESTOSTERONE: Testosterone: 167.93 ng/dL — ABNORMAL LOW (ref 300.00–890.00)

## 2024-01-11 LAB — MICROALBUMIN / CREATININE URINE RATIO
Creatinine,U: 182.8 mg/dL
Microalb Creat Ratio: 38.8 mg/g — ABNORMAL HIGH (ref 0.0–30.0)
Microalb, Ur: 7.1 mg/dL — ABNORMAL HIGH (ref 0.0–1.9)

## 2024-01-11 MED ORDER — ATORVASTATIN CALCIUM 20 MG PO TABS: 20.0000 mg | ORAL_TABLET | Freq: Every day | ORAL | 3 refills | Status: DC

## 2024-01-31 ENCOUNTER — Other Ambulatory Visit (INDEPENDENT_AMBULATORY_CARE_PROVIDER_SITE_OTHER)

## 2024-01-31 DIAGNOSIS — E291 Testicular hypofunction: Secondary | ICD-10-CM

## 2024-01-31 LAB — TESTOSTERONE: Testosterone: 255.58 ng/dL — ABNORMAL LOW (ref 300.00–890.00)

## 2024-02-01 LAB — TESTOSTERONE TOTAL,FREE,BIO, MALES
Albumin: 4.2 g/dL (ref 3.6–5.1)
Sex Hormone Binding: 17 nmol/L (ref 10–50)
Testosterone, Bioavailable: 191.1 ng/dL (ref 110.0–575.0)
Testosterone, Free: 99.2 pg/mL (ref 46.0–224.0)
Testosterone: 442 ng/dL (ref 250–827)

## 2024-02-01 LAB — FSH/LH
FSH: 5 m[IU]/mL (ref 1.4–12.8)
LH: 3.3 m[IU]/mL (ref 1.5–9.3)

## 2024-02-05 ENCOUNTER — Ambulatory Visit: Payer: Self-pay | Admitting: Family Medicine

## 2024-02-29 ENCOUNTER — Other Ambulatory Visit: Payer: Self-pay | Admitting: Family Medicine

## 2024-02-29 DIAGNOSIS — E119 Type 2 diabetes mellitus without complications: Secondary | ICD-10-CM

## 2024-03-14 ENCOUNTER — Ambulatory Visit: Admitting: Family Medicine

## 2024-04-03 ENCOUNTER — Other Ambulatory Visit: Payer: Self-pay | Admitting: Family Medicine

## 2024-04-03 DIAGNOSIS — E119 Type 2 diabetes mellitus without complications: Secondary | ICD-10-CM

## 2024-04-03 NOTE — Telephone Encounter (Signed)
 Left VM to rtn call to schedule a follow up appointment.   Dm/cma

## 2024-04-22 ENCOUNTER — Ambulatory Visit: Admitting: Family Medicine

## 2024-04-22 ENCOUNTER — Encounter: Payer: Self-pay | Admitting: Family Medicine

## 2024-04-22 VITALS — BP 122/88 | HR 78 | Ht 71.0 in | Wt 284.4 lb

## 2024-04-22 DIAGNOSIS — G7000 Myasthenia gravis without (acute) exacerbation: Secondary | ICD-10-CM | POA: Diagnosis not present

## 2024-04-22 DIAGNOSIS — K760 Fatty (change of) liver, not elsewhere classified: Secondary | ICD-10-CM | POA: Diagnosis not present

## 2024-04-22 DIAGNOSIS — Z87898 Personal history of other specified conditions: Secondary | ICD-10-CM

## 2024-04-22 DIAGNOSIS — R809 Proteinuria, unspecified: Secondary | ICD-10-CM | POA: Diagnosis not present

## 2024-04-22 DIAGNOSIS — E66812 Obesity, class 2: Secondary | ICD-10-CM

## 2024-04-22 DIAGNOSIS — E1165 Type 2 diabetes mellitus with hyperglycemia: Secondary | ICD-10-CM

## 2024-04-22 DIAGNOSIS — Z79899 Other long term (current) drug therapy: Secondary | ICD-10-CM

## 2024-04-22 LAB — POCT GLYCOSYLATED HEMOGLOBIN (HGB A1C): Hemoglobin A1C: 6.6 % — AB (ref 4.0–5.6)

## 2024-04-22 MED ORDER — TIRZEPATIDE 5 MG/0.5ML ~~LOC~~ SOAJ: 5.0000 mg | SUBCUTANEOUS | 2 refills | Status: AC

## 2024-04-22 MED ORDER — FREESTYLE LIBRE 3 PLUS SENSOR MISC: 3 refills | Status: AC

## 2024-04-22 NOTE — Progress Notes (Unsigned)
 1. Type 2 diabetes mellitus with hyperglycemia, without long-term current use of insulin (HCC)    Meds ordered this encounter  Medications   tirzepatide  (MOUNJARO ) 5 MG/0.5ML Pen    Sig: Inject 5 mg into the skin once a week.    Dispense:  2 mL    Refill:  2   Continuous Glucose Sensor (FREESTYLE LIBRE 3 PLUS SENSOR) MISC    Sig: Change sensor every 15 days.    Dispense:  6 each    Refill:  3    Assessment and Plan Assessment & Plan Type 2 diabetes mellitus Metformin  discontinued due to gastrointestinal side effects. Prednisone  complicates glycemic control. Mounjaro  considered for better efficacy and fewer side effects. Discussed CGM for monitoring during prednisone  tapering. - Discontinued metformin . - Offered continuous glucose monitor (CGM) for blood glucose monitoring. - Performed finger prick A1c test.  Myasthenia gravis Managed with prednisone  and Mestinon . Prednisone  reduced to 5 mg. Mounjaro  considered safe with myasthenia gravis. - Continue prednisone  at 5 mg daily. - Continue Mestinon  as prescribed. - Monitor for flares and adjust prednisone  dosage as needed.  Fatty liver disease Likely related to obesity and diabetes. Mounjaro  may aid in weight loss and improve liver function.  Pulmonary nodule, follow-up needed Pulmonary nodule identified on CT from 2024, follow-up imaging overdue.  Proteinuria Mild proteinuria with slightly elevated microalbumin. Kidney function well-managed with GFR in the 90s. Mounjaro  may help improve kidney function.  Geni Shutter, DO, MS, FAAFP, Dipl. [facility] [address] [place], [address] Dept: [phone] Dept Fax: [phone]  Subjective:   Chart updated today with reconciliation of problem list, medications, allergies, and relevant history. Preventive care and chronic disease status reviewed. Portions of historical chart may remain incomplete; will update on an ongoing  basis as clinically indicated.  Discussed the use of AI scribe software for clinical note transcription with the patient, who gave verbal consent to proceed. History of Present Illness Terrence Hoover is a 43 year old male with diabetes and myasthenia gravis who presents for follow-up of his diabetes management.  Hyperglycemia and diabetes management - Diabetes previously well controlled with A1c of 7%. - A1c increased to 10% after initiation of prednisone  for myasthenia gravis. - Currently on Ozempic  0.5 mg weekly and metformin . 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 - Concern about potential kidney problems from metformin  based on family experiences. - No side effects from Ozempic . - Does not use a continuous glucose monitor. - Checks fingerstick glucose only occasionally at work when feeling unwell.  Myasthenia gravis management - Currently treated with prednisone  5 mg daily and Mestinon . - Minimal recent flare-ups on current regimen.  Hepatic and pulmonary findings - History of fatty liver. - Lung nodule requiring follow-up.  Sleep disturbance - No current symptoms of sleep apnea. - Poor sleep attributed to work environment and wife's snoring.  Review of Systems: Negative, with the exception of above mentioned in HPI.  Current Outpatient Medications:    amLODipine  (NORVASC ) 10 MG tablet, Take 1 tablet (10 mg total) by mouth daily., Disp: 90 tablet, Rfl: 3   Continuous Glucose Sensor (FREESTYLE LIBRE 3 PLUS SENSOR) MISC, Change sensor every 15 days., Disp: 6 each, Rfl: 3   predniSONE  (DELTASONE ) 5 MG tablet, Take 1 tablet (5 mg total) by mouth daily with breakfast., Disp: 90 tablet, Rfl: 3   pyridostigmine  (MESTINON ) 60 MG tablet, TAKE 1  TABLET BY MOUTH 3 TIMES DAILY., Disp: 90 tablet, Rfl: 5   tirzepatide  (MOUNJARO ) 5 MG/0.5ML Pen, Inject 5 mg into the skin once a  week., Disp: 2 mL, Rfl: 2   Objective:   BP 122/88 (BP Location: Right Arm, Cuff Size: Large)   Pulse 78   Ht 5' 11 (1.803 m)   Wt 284 lb 6.4 oz (129 kg)   SpO2 96%   BMI 39.67 kg/m   Wt Readings from Last 3 Encounters:  04/22/24 284 lb 6.4 oz (129 kg)  01/10/24 292 lb 12.8 oz (132.8 kg)  01/10/24 292 lb (132.5 kg)   Physical Exam Constitutional:      General: He is not in acute distress.    Appearance: He is well-developed.  HENT:     Head: Normocephalic and atraumatic.  Eyes:     Conjunctiva/sclera: Conjunctivae normal.  Cardiovascular:     Rate and Rhythm: Normal rate and regular rhythm.     Heart sounds: Normal heart sounds.  Pulmonary:     Effort: Pulmonary effort is normal.     Breath sounds: Normal breath sounds.  Neurological:     General: No focal deficit present.     Mental Status: He is alert.  Psychiatric:        Behavior: Behavior normal.    Lab Results  Component Value Date   CREATININE 0.96 01/11/2024   BUN 8 01/11/2024   NA 139 01/11/2024   K 3.8 01/11/2024   CL 104 01/11/2024   CO2 23 01/11/2024   Lab Results  Component Value Date   ALT 42 08/03/2022   AST 24 08/03/2022   ALKPHOS 64 08/03/2022   BILITOT 0.5 08/03/2022   Lab Results  Component Value Date   HGBA1C 6.6 (A) 04/22/2024   HGBA1C 11.0 (H) 01/11/2024   HGBA1C 7.9 (H) 01/02/2023   HGBA1C 7.0 (H) 09/07/2022   HGBA1C 6.5 08/03/2022   No results found for: INSULIN Lab Results  Component Value Date   TSH 2.00 08/03/2022   Lab Results  Component Value Date   CHOL 198 01/11/2024   HDL 37.60 (L) 01/11/2024   LDLCALC 117 (H) 01/11/2024   TRIG 214.0 (H) 01/11/2024   CHOLHDL 5 01/11/2024   Lab Results  Component Value Date   VD25OH 10.13 (L) 02/06/2020   Lab Results  Component Value Date   WBC 7.6 08/03/2022   HGB 16.4 08/03/2022   HCT 47.4 08/03/2022   MCV 89.4 08/03/2022   PLT 270.0 08/03/2022

## 2024-04-23 ENCOUNTER — Encounter: Payer: Self-pay | Admitting: Family Medicine

## 2024-04-23 DIAGNOSIS — K760 Fatty (change of) liver, not elsewhere classified: Secondary | ICD-10-CM | POA: Insufficient documentation

## 2024-04-23 DIAGNOSIS — Z87898 Personal history of other specified conditions: Secondary | ICD-10-CM | POA: Insufficient documentation

## 2024-04-23 DIAGNOSIS — Z79899 Other long term (current) drug therapy: Secondary | ICD-10-CM | POA: Insufficient documentation

## 2024-04-23 DIAGNOSIS — Z6839 Body mass index (BMI) 39.0-39.9, adult: Secondary | ICD-10-CM | POA: Insufficient documentation

## 2024-05-06 ENCOUNTER — Telehealth: Payer: Self-pay

## 2024-05-06 ENCOUNTER — Other Ambulatory Visit (HOSPITAL_COMMUNITY): Payer: Self-pay

## 2024-05-06 NOTE — Telephone Encounter (Signed)
 Copied from CRM #[id number]. Topic: Clinical - Medication Question >> May 06, 2024 10:18 AM Robinson H wrote: Reason for CRM: Patient was put on tirzepatide  (MOUNJARO ) 5 MG/0.5ML Pen  insurance not covering medication, wife Tawni states Dr. Thedora had to complete a form for her stating that medication is for diabetic purposes and not weight loss, if provider not able to complete form patient needs a different cheaper medication if possible. Best way to communicate with patient is through MyChart if no answer, patient is a theatre stage manager. Wife also states the Continuous Glucose Sensor (FREESTYLE LIBRE 3 PLUS SENSOR) MISC is 300 out of pocket as well and wants to know if there is something different and cheaper as well.  Daking [phone] Tawni [phone]

## 2024-05-06 NOTE — Telephone Encounter (Signed)
 Pharmacy Patient Advocate Encounter   Received notification from Physician's Office that prior authorization for Mounjaro  5 mg/0.5 ml pen is required/requested.   Insurance verification completed.   The patient is insured through [facility].   Per test claim: The current 28 day co-pay is, $25.  No PA needed at this time. This test claim was processed through [facility]- copay amounts may vary at other pharmacies due to pharmacy/plan contracts, or as the patient moves through the different stages of their insurance plan.

## 2024-05-06 NOTE — Telephone Encounter (Signed)
 Can you please and thank you complete the PA for the Mounjaro ?  Thanks. Dm/cma

## 2024-05-07 NOTE — Telephone Encounter (Signed)
Sent a my chart message to patient.  Dm/cma

## 2024-07-18 ENCOUNTER — Ambulatory Visit: Admitting: Neurology

## 2024-07-23 ENCOUNTER — Ambulatory Visit: Admitting: Family Medicine
